# Patient Record
Sex: Male | Born: 1950
Health system: Southern US, Community
[De-identification: ages and names within clinical notes are randomized; demographics above are authoritative.]

## PROBLEM LIST (undated history)

## (undated) DIAGNOSIS — I10 Essential (primary) hypertension: Secondary | ICD-10-CM

## (undated) DIAGNOSIS — R7303 Prediabetes: Secondary | ICD-10-CM

## (undated) DIAGNOSIS — M1990 Unspecified osteoarthritis, unspecified site: Secondary | ICD-10-CM

## (undated) HISTORY — PX: CARPAL TUNNEL RELEASE: SHX101

---

## 2007-10-27 ENCOUNTER — Encounter: Admission: RE | Admit: 2007-10-27 | Discharge: 2007-10-27 | Payer: Self-pay | Admitting: Otolaryngology

## 2007-11-03 ENCOUNTER — Other Ambulatory Visit: Admission: RE | Admit: 2007-11-03 | Discharge: 2007-11-03 | Payer: Self-pay | Admitting: Otolaryngology

## 2008-04-02 HISTORY — PX: KNEE SURGERY: SHX244

## 2009-06-08 ENCOUNTER — Ambulatory Visit (HOSPITAL_BASED_OUTPATIENT_CLINIC_OR_DEPARTMENT_OTHER): Admission: RE | Admit: 2009-06-08 | Discharge: 2009-06-08 | Payer: Self-pay | Admitting: Orthopedic Surgery

## 2010-06-26 LAB — POCT HEMOGLOBIN-HEMACUE: Hemoglobin: 14.5 g/dL (ref 13.0–17.0)

## 2012-03-21 ENCOUNTER — Ambulatory Visit: Payer: BC Managed Care – PPO

## 2012-03-21 ENCOUNTER — Ambulatory Visit (INDEPENDENT_AMBULATORY_CARE_PROVIDER_SITE_OTHER): Payer: BC Managed Care – PPO | Admitting: Family Medicine

## 2012-03-21 VITALS — BP 158/89 | HR 77 | Temp 98.0°F | Resp 16 | Ht 69.0 in | Wt 221.0 lb

## 2012-03-21 DIAGNOSIS — M25512 Pain in left shoulder: Secondary | ICD-10-CM

## 2012-03-21 DIAGNOSIS — M542 Cervicalgia: Secondary | ICD-10-CM

## 2012-03-21 DIAGNOSIS — M25552 Pain in left hip: Secondary | ICD-10-CM

## 2012-03-21 DIAGNOSIS — M25559 Pain in unspecified hip: Secondary | ICD-10-CM

## 2012-03-21 DIAGNOSIS — M25519 Pain in unspecified shoulder: Secondary | ICD-10-CM

## 2012-03-21 MED ORDER — DICLOFENAC SODIUM 75 MG PO TBEC: 75.0000 mg | DELAYED_RELEASE_TABLET | Freq: Two times a day (BID) | ORAL | Status: DC

## 2012-03-21 MED ORDER — CYCLOBENZAPRINE HCL 5 MG PO TABS: 5.0000 mg | ORAL_TABLET | Freq: Two times a day (BID) | ORAL | Status: DC
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
## 2012-03-28 ENCOUNTER — Ambulatory Visit (INDEPENDENT_AMBULATORY_CARE_PROVIDER_SITE_OTHER): Payer: BC Managed Care – PPO | Admitting: Family Medicine

## 2012-03-28 ENCOUNTER — Encounter: Payer: Self-pay | Admitting: Family Medicine

## 2012-03-28 VITALS — BP 154/93 | HR 73 | Temp 98.3°F | Resp 16 | Ht 69.0 in | Wt 226.6 lb

## 2012-03-28 DIAGNOSIS — M25559 Pain in unspecified hip: Secondary | ICD-10-CM

## 2012-03-28 DIAGNOSIS — M62838 Other muscle spasm: Secondary | ICD-10-CM

## 2012-03-28 DIAGNOSIS — M25512 Pain in left shoulder: Secondary | ICD-10-CM

## 2012-03-28 DIAGNOSIS — M25519 Pain in unspecified shoulder: Secondary | ICD-10-CM

## 2012-03-28 DIAGNOSIS — M25552 Pain in left hip: Secondary | ICD-10-CM

## 2012-03-28 DIAGNOSIS — H538 Other visual disturbances: Secondary | ICD-10-CM

## 2012-03-28 MED ORDER — CYCLOBENZAPRINE HCL 5 MG PO TABS: ORAL_TABLET | ORAL | Status: DC
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
## 2012-04-02 DIAGNOSIS — Z0271 Encounter for disability determination: Secondary | ICD-10-CM

## 2012-04-04 ENCOUNTER — Ambulatory Visit (INDEPENDENT_AMBULATORY_CARE_PROVIDER_SITE_OTHER): Payer: BC Managed Care – PPO | Admitting: Family Medicine

## 2012-04-04 VITALS — BP 146/81 | HR 84 | Temp 98.2°F | Resp 18 | Ht 68.5 in | Wt 222.0 lb

## 2012-04-04 DIAGNOSIS — M25552 Pain in left hip: Secondary | ICD-10-CM

## 2012-04-04 DIAGNOSIS — M25519 Pain in unspecified shoulder: Secondary | ICD-10-CM

## 2012-04-04 DIAGNOSIS — M25512 Pain in left shoulder: Secondary | ICD-10-CM

## 2012-04-04 DIAGNOSIS — M25559 Pain in unspecified hip: Secondary | ICD-10-CM

## 2012-04-04 MED ORDER — DICLOFENAC SODIUM 75 MG PO TBEC: 75.0000 mg | DELAYED_RELEASE_TABLET | Freq: Two times a day (BID) | ORAL | Status: DC

## 2012-04-04 NOTE — Patient Instructions (Addendum)
Return to see Dr Neva Seat either Tuesday 1/14 9-5pm or Wednesday 1/15 2-8:30 pm  Continue diclofenac as needed, cyclobenzaprine u pto 3 times a day only as needed, continue to work on range of motion of shoulder and neck.  If any increase or worsening of headaches - return to clinic or go to an ER for possible ct scan of brain.   Return to the clinic or go to the nearest emergency room if any of your symptoms worsen or new symptoms occur.
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
## 2012-04-16 ENCOUNTER — Ambulatory Visit (INDEPENDENT_AMBULATORY_CARE_PROVIDER_SITE_OTHER): Payer: BC Managed Care – PPO | Admitting: Family Medicine

## 2012-04-16 VITALS — BP 132/86 | HR 81 | Temp 97.9°F | Resp 18 | Ht 68.5 in | Wt 216.0 lb

## 2012-04-16 DIAGNOSIS — M25519 Pain in unspecified shoulder: Secondary | ICD-10-CM

## 2012-04-16 DIAGNOSIS — S060X9A Concussion with loss of consciousness of unspecified duration, initial encounter: Secondary | ICD-10-CM

## 2012-04-16 DIAGNOSIS — M25559 Pain in unspecified hip: Secondary | ICD-10-CM

## 2012-04-16 DIAGNOSIS — R51 Headache: Secondary | ICD-10-CM

## 2012-04-16 NOTE — Patient Instructions (Signed)
As your headaches are resolved off meds, and neck, shoulder and hip are improved you can slowly return to exercise. Start with very low weights and less time than prior and slowly increase as tolerated. Correct lifting technique with lower weights is important.  If any headache - stop exercise and rest for few days before attempting to return. If headaches persist or worsen - return to clinic or the emergency room.  We can recheck in the next 3-4 weeks.  Return to the clinic or go to the nearest emergency room if any of your symptoms worsen or new symptoms occur.
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
## 2012-04-24 ENCOUNTER — Other Ambulatory Visit: Payer: Self-pay | Admitting: Family Medicine

## 2012-05-19 ENCOUNTER — Telehealth: Payer: Self-pay | Admitting: *Deleted

## 2012-05-19 NOTE — Telephone Encounter (Signed)
[facility] [address] requesting refill on diclofenac 75mg  and cyclobenzaprine 5mg .  Last fill 04/24/12

## 2012-05-20 ENCOUNTER — Ambulatory Visit (INDEPENDENT_AMBULATORY_CARE_PROVIDER_SITE_OTHER): Payer: Federal, State, Local not specified - PPO | Admitting: Family Medicine

## 2012-05-20 VITALS — BP 160/90 | HR 70 | Temp 98.2°F | Resp 18 | Ht 68.5 in | Wt 222.2 lb

## 2012-05-20 DIAGNOSIS — M25519 Pain in unspecified shoulder: Secondary | ICD-10-CM

## 2012-05-20 DIAGNOSIS — M542 Cervicalgia: Secondary | ICD-10-CM

## 2012-05-20 DIAGNOSIS — M25512 Pain in left shoulder: Secondary | ICD-10-CM

## 2012-05-20 DIAGNOSIS — R05 Cough: Secondary | ICD-10-CM

## 2012-05-20 DIAGNOSIS — M545 Low back pain, unspecified: Secondary | ICD-10-CM

## 2012-05-20 DIAGNOSIS — R059 Cough, unspecified: Secondary | ICD-10-CM

## 2012-05-20 MED ORDER — CYCLOBENZAPRINE HCL 5 MG PO TABS: ORAL_TABLET | ORAL | Status: DC

## 2012-05-20 MED ORDER — DICLOFENAC SODIUM 75 MG PO TBEC: DELAYED_RELEASE_TABLET | ORAL | Status: DC

## 2012-05-20 NOTE — Telephone Encounter (Signed)
The patient was to follow-up 3-4 weeks.  It has been 4.  If he still needs these meds, he should RTC.

## 2012-05-20 NOTE — Patient Instructions (Signed)
Your shoulder, neck, and hip pain,  and headaches have resolved, so no further visits needde for this unless symptoms return/worsen.  For your low back pain, likely muscle spasm with shoveling - ok to take flexeril at night, diclofenac if needed.   Start the blood pressure medicine as prescribed by your primary care provider and follow up there in next few weeks.  Return to the clinic or go to the nearest emergency room if any of your symptoms worsen or new symptoms occur.  For cough and cold - Saline nasal spray atleast 4 times per day if needed for congestion, over the counter mucinex or mucinex DM for cough, drink plenty of fluids.  Return to clinic if worsening.
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
## 2012-05-20 NOTE — Telephone Encounter (Signed)
Patient was seen today and got these.

## 2012-06-12 DIAGNOSIS — Z0271 Encounter for disability determination: Secondary | ICD-10-CM

## 2014-02-01 ENCOUNTER — Ambulatory Visit (INDEPENDENT_AMBULATORY_CARE_PROVIDER_SITE_OTHER): Payer: Federal, State, Local not specified - PPO | Admitting: Podiatry

## 2014-02-01 ENCOUNTER — Encounter: Payer: Self-pay | Admitting: Podiatry

## 2014-02-01 ENCOUNTER — Ambulatory Visit (INDEPENDENT_AMBULATORY_CARE_PROVIDER_SITE_OTHER): Payer: Federal, State, Local not specified - PPO

## 2014-02-01 VITALS — BP 146/90 | HR 68 | Resp 16 | Ht 68.0 in | Wt 228.0 lb

## 2014-02-01 DIAGNOSIS — M779 Enthesopathy, unspecified: Secondary | ICD-10-CM

## 2014-02-01 MED ORDER — TRIAMCINOLONE ACETONIDE 10 MG/ML IJ SUSP
10.0000 mg | Freq: Once | INTRAMUSCULAR | Status: AC
  Administered 2014-02-01: 10 mg

## 2014-02-01 NOTE — Progress Notes (Signed)
   Subjective:    Patient ID: Dean Williams, male    DOB: [birth year], 63 y.o.   MRN: [id number]  HPI Comments: "I have pain on the outside of my ankle area"  Patient c/o aching lateral foot and ankle bilateral for few years. He is active in the gym and runs. Doing exercise aggravates it. He has been using his tramadol and Ibuprofen-some help.     Review of Systems  HENT: Positive for sinus pressure.   Musculoskeletal: Positive for back pain, arthralgias and gait problem.  All other systems reviewed and are negative.      Objective:   Physical Exam        Assessment & Plan:

## 2014-02-02 NOTE — Progress Notes (Signed)
Subjective:     Patient ID: Dean Williams, male   DOB: [birth year], 63 y.o.   MRN: [id number]  HPIpatient presents stating the outside of his ankles of become quite a bit more sore over the last few months as he is increased his activity levels. States they are starting to hinder his ability to be active   Review of Systems  All other systems reviewed and are negative.      Objective:   Physical Exam  Constitutional: He is oriented to person, place, and time.  Cardiovascular: Intact distal pulses.   Musculoskeletal: Normal range of motion.  Neurological: He is oriented to person, place, and time.  Skin: Skin is warm.  Nursing note and vitals reviewed.  neurovascular status found to be intact with muscle strength adequate and range of motion subtalar and midtarsal joint within normal limits. Patient has normal inversion eversion and I noted on the lateral side of the ankles of both feet there is quite a bit of discomfort around the peroneal group with no indications of muscle strength loss. No tears are noted at this time     Assessment:     Probable peroneal tendinitis bilateral    Plan:     H&P and x-rays reviewed. Careful injection of the sheath of the peroneal tendon accomplished 3 mg Kenalog 5 mg Xylocaine and applied fascially brace bilateral and order to reduce motion. Reappoint to reevaluate in 2 weeks

## 2014-02-15 ENCOUNTER — Ambulatory Visit (INDEPENDENT_AMBULATORY_CARE_PROVIDER_SITE_OTHER): Payer: Federal, State, Local not specified - PPO | Admitting: Podiatry

## 2014-02-15 ENCOUNTER — Encounter: Payer: Self-pay | Admitting: Podiatry

## 2014-02-15 VITALS — BP 146/90 | HR 68 | Resp 16

## 2014-02-15 DIAGNOSIS — M779 Enthesopathy, unspecified: Secondary | ICD-10-CM

## 2014-02-16 NOTE — Progress Notes (Signed)
Subjective:     Patient ID: Dean Williams, male   DOB: 1950/09/14, 63 y.o.   MRN: [id number]  HPImy feet have been felt this good for years but they have been bothering me for a long time and I walk on cement floors   Review of Systems     Objective:   Physical Exam Neurovascular status intact with continued discomfort of a moderate nature at the peroneal insertion fifth metatarsal base with moderate flatfoot deformity and chronic tendinitis-like symptoms    Assessment:     Chronic structural changes with tendinitis causing pain    Plan:     Reviewed condition and recommended custom orthotics and scanned for custom orthotics at this time. Patient will utilize ice and will be seen back when orthotics are ready

## 2014-03-01 ENCOUNTER — Telehealth: Payer: Self-pay | Admitting: *Deleted

## 2014-03-01 NOTE — Telephone Encounter (Signed)
Scheduled patient to come in on 12.01.2015 to see Dr. Paulla Dolly for the pain and to puo.

## 2014-03-01 NOTE — Telephone Encounter (Signed)
Pt asked if his orthotics had arrived, and stated he is in severe pain and would like an appt.  I informed pt the orthotics were in and I referred to the schedulers for an appt this week due to his severe pain.

## 2014-03-02 ENCOUNTER — Ambulatory Visit (INDEPENDENT_AMBULATORY_CARE_PROVIDER_SITE_OTHER): Payer: Federal, State, Local not specified - PPO

## 2014-03-02 ENCOUNTER — Encounter: Payer: Self-pay | Admitting: Podiatry

## 2014-03-02 ENCOUNTER — Ambulatory Visit (INDEPENDENT_AMBULATORY_CARE_PROVIDER_SITE_OTHER): Payer: Federal, State, Local not specified - PPO | Admitting: Podiatry

## 2014-03-02 VITALS — BP 158/91 | HR 73 | Resp 16

## 2014-03-02 DIAGNOSIS — M7662 Achilles tendinitis, left leg: Secondary | ICD-10-CM

## 2014-03-02 MED ORDER — DICLOFENAC SODIUM 75 MG PO TBEC: 75.0000 mg | DELAYED_RELEASE_TABLET | Freq: Two times a day (BID) | ORAL | Status: DC

## 2014-03-02 MED ORDER — TRIAMCINOLONE ACETONIDE 10 MG/ML IJ SUSP
10.0000 mg | Freq: Once | INTRAMUSCULAR | Status: AC
  Administered 2014-03-02: 10 mg

## 2014-03-02 NOTE — Progress Notes (Signed)
Subjective:     Patient ID: Dean Williams, male   DOB: 06/16/1950, 63 y.o.   MRN: [id number]  HPI patient presents stating I am not having the pain in the outside of my left foot but the back of the heel has become extremely tender and I'm having trouble walking   Review of Systems     Objective:   Physical Exam Neurovascular status intact with muscle strength adequate and patient noted to have posterior lateral pain in the Achilles tendon left with no involvement of the musculotendinous junction    Assessment:     Achilles tendinitis left insertional in nature of an acute nature    Plan:     H&P and condition discussed. Careful lateral injection administered after first discussing the risk of the procedure with patient and chances for rupture. I did not put it directly into the tendon the Lateral and utilized 3 mg dexamethasone Kenalog 5 mg Xylocaine and advised on reduced activity and ice. Orthotics were dispensed with instructions

## 2014-03-02 NOTE — Patient Instructions (Signed)

## 2014-03-17 ENCOUNTER — Ambulatory Visit (INDEPENDENT_AMBULATORY_CARE_PROVIDER_SITE_OTHER): Payer: Federal, State, Local not specified - PPO | Admitting: Podiatry

## 2014-03-17 VITALS — BP 148/87 | HR 78 | Resp 16

## 2014-03-17 DIAGNOSIS — M7662 Achilles tendinitis, left leg: Secondary | ICD-10-CM

## 2014-03-17 DIAGNOSIS — M779 Enthesopathy, unspecified: Secondary | ICD-10-CM

## 2014-03-18 NOTE — Progress Notes (Signed)
Subjective:     Patient ID: Dean Williams, male   DOB: Jul 24, 1950, 63 y.o.   MRN: [id number]  HPI patient presents stating I am feeling a lot better in my heel and able to walk distances without pain   Review of Systems     Objective:   Physical Exam Neurovascular status intact with muscle strength adequate range of motion within normal limits and minimal discomfort when the posterior heel is palpated left    Assessment:     Achilles tendinitis left that's improving    Plan:     Advised on physical therapy anti-inflammatory and elevated heels in order to lift the posterior heel up. Reappoint to recheck

## 2014-04-09 DIAGNOSIS — Z23 Encounter for immunization: Secondary | ICD-10-CM | POA: Diagnosis not present

## 2014-04-27 DIAGNOSIS — M1712 Unilateral primary osteoarthritis, left knee: Secondary | ICD-10-CM | POA: Diagnosis not present

## 2014-05-17 ENCOUNTER — Encounter: Payer: Self-pay | Admitting: Podiatry

## 2014-05-17 ENCOUNTER — Ambulatory Visit (INDEPENDENT_AMBULATORY_CARE_PROVIDER_SITE_OTHER): Payer: Medicare Other | Admitting: Podiatry

## 2014-05-17 VITALS — BP 165/93 | HR 90 | Resp 16

## 2014-05-17 DIAGNOSIS — M7661 Achilles tendinitis, right leg: Secondary | ICD-10-CM | POA: Diagnosis not present

## 2014-05-17 DIAGNOSIS — M779 Enthesopathy, unspecified: Secondary | ICD-10-CM

## 2014-05-17 MED ORDER — TRIAMCINOLONE ACETONIDE 10 MG/ML IJ SUSP
10.0000 mg | Freq: Once | INTRAMUSCULAR | Status: AC
  Administered 2014-05-17: 10 mg

## 2014-05-17 NOTE — Patient Instructions (Signed)

## 2014-05-17 NOTE — Progress Notes (Signed)
Subjective:     Patient ID: Dean Williams, male   DOB: 02/24/1951, 64 y.o.   MRN: [id number]  HPIpatient states that his left heel is doing great but he has started to develop pain in his right heel that is making ambulation difficult. States it has worsened over the last month   Review of Systems     Objective:   Physical Exam Neurovascular status unchanged with pt. Well oriented.  He has pain lateral side of the achilles insertion with the central and medial side asymptomatic.    Assessment:     Acute achilles tendonitis left heel lateral side    Plan:     Reviewed condition and the importance of stretching which I rewrote him for today. Discussed injection which he wants understanding the risk of rupture. I carefully injected the lateral side with 2mg . Dexamethasone 1mg . Kenalog and 5mg . Xylocaine keeping it away from the central and medial side. Advised on reduced activity and ice therapy. Reappoint if symptoms persist

## 2014-06-07 DIAGNOSIS — M1712 Unilateral primary osteoarthritis, left knee: Secondary | ICD-10-CM | POA: Diagnosis not present

## 2014-06-14 DIAGNOSIS — M1712 Unilateral primary osteoarthritis, left knee: Secondary | ICD-10-CM | POA: Diagnosis not present

## 2014-06-22 DIAGNOSIS — M1712 Unilateral primary osteoarthritis, left knee: Secondary | ICD-10-CM | POA: Diagnosis not present

## 2014-06-29 DIAGNOSIS — M1712 Unilateral primary osteoarthritis, left knee: Secondary | ICD-10-CM | POA: Diagnosis not present

## 2014-07-06 DIAGNOSIS — M1712 Unilateral primary osteoarthritis, left knee: Secondary | ICD-10-CM | POA: Diagnosis not present

## 2014-08-03 DIAGNOSIS — M1712 Unilateral primary osteoarthritis, left knee: Secondary | ICD-10-CM | POA: Diagnosis not present

## 2014-08-03 DIAGNOSIS — M1711 Unilateral primary osteoarthritis, right knee: Secondary | ICD-10-CM | POA: Diagnosis not present

## 2014-08-25 DIAGNOSIS — M25521 Pain in right elbow: Secondary | ICD-10-CM | POA: Diagnosis not present

## 2014-08-26 DIAGNOSIS — M25521 Pain in right elbow: Secondary | ICD-10-CM | POA: Diagnosis not present

## 2014-08-26 DIAGNOSIS — M7021 Olecranon bursitis, right elbow: Secondary | ICD-10-CM | POA: Diagnosis not present

## 2014-09-02 ENCOUNTER — Encounter: Payer: Self-pay | Admitting: Podiatry

## 2014-09-02 ENCOUNTER — Ambulatory Visit (INDEPENDENT_AMBULATORY_CARE_PROVIDER_SITE_OTHER): Payer: Medicare Other | Admitting: Podiatry

## 2014-09-02 VITALS — BP 158/101 | HR 71 | Resp 18

## 2014-09-02 DIAGNOSIS — M722 Plantar fascial fibromatosis: Secondary | ICD-10-CM

## 2014-09-02 DIAGNOSIS — M7661 Achilles tendinitis, right leg: Secondary | ICD-10-CM

## 2014-09-02 NOTE — Progress Notes (Signed)
Subjective:     Patient ID: Dean Williams, male   DOB: [birth year], 64 y.o.   MRN: [id number]  HPI patient states that I am doing better but I still need to have braces to lift up my arches as they still get sore with activities   Review of Systems     Objective:   Physical Exam Vascular status intact muscle strength adequate range of motion within normal limits with discomfort in the arch region bilateral to moderate nature and especially when he tries to be active    Assessment:     Chronic plantar fascial symptomatology    Plan:     Reviewed conservative care consisting of brace usage orthotics and physical therapy. Reappoint to recheck as needed evaluated the nail which she traumatized and explained it should grow out okay but may eventually lose the left hallux nailbed due to the trauma

## 2014-09-09 DIAGNOSIS — M7021 Olecranon bursitis, right elbow: Secondary | ICD-10-CM | POA: Diagnosis not present

## 2014-10-28 DIAGNOSIS — M67911 Unspecified disorder of synovium and tendon, right shoulder: Secondary | ICD-10-CM | POA: Diagnosis not present

## 2014-10-28 DIAGNOSIS — M24811 Other specific joint derangements of right shoulder, not elsewhere classified: Secondary | ICD-10-CM | POA: Diagnosis not present

## 2014-10-29 DIAGNOSIS — M7021 Olecranon bursitis, right elbow: Secondary | ICD-10-CM | POA: Diagnosis not present

## 2014-10-29 DIAGNOSIS — M7521 Bicipital tendinitis, right shoulder: Secondary | ICD-10-CM | POA: Diagnosis not present

## 2014-11-01 DIAGNOSIS — M7021 Olecranon bursitis, right elbow: Secondary | ICD-10-CM | POA: Diagnosis not present

## 2014-11-08 DIAGNOSIS — M25521 Pain in right elbow: Secondary | ICD-10-CM | POA: Diagnosis not present

## 2014-11-11 DIAGNOSIS — M7021 Olecranon bursitis, right elbow: Secondary | ICD-10-CM | POA: Diagnosis not present

## 2014-11-11 DIAGNOSIS — M778 Other enthesopathies, not elsewhere classified: Secondary | ICD-10-CM | POA: Diagnosis not present

## 2014-12-22 DIAGNOSIS — M778 Other enthesopathies, not elsewhere classified: Secondary | ICD-10-CM | POA: Diagnosis not present

## 2014-12-22 DIAGNOSIS — M7021 Olecranon bursitis, right elbow: Secondary | ICD-10-CM | POA: Diagnosis not present

## 2015-01-07 ENCOUNTER — Telehealth: Payer: Self-pay | Admitting: *Deleted

## 2015-01-07 DIAGNOSIS — M7662 Achilles tendinitis, left leg: Secondary | ICD-10-CM

## 2015-01-07 MED ORDER — DICLOFENAC SODIUM 75 MG PO TBEC: 75.0000 mg | DELAYED_RELEASE_TABLET | Freq: Two times a day (BID) | ORAL | Status: DC

## 2015-01-07 NOTE — Telephone Encounter (Signed)
Pt states he wore hard sole shoes, on a hard floor last week and his achilles has flared again. I offered pt an appt, explaining he may run the risk of a rupture, to rest and make an appt.  Dr. Paulla Dolly ordered refill once and have pt make an appt if no better.  Pt agreed.  I ordered the Diclofenac 75mg  #50 one tablet bid no refills.

## 2015-01-12 ENCOUNTER — Telehealth: Payer: Self-pay | Admitting: Podiatry

## 2015-01-12 ENCOUNTER — Telehealth: Payer: Self-pay | Admitting: *Deleted

## 2015-01-12 NOTE — Telephone Encounter (Signed)
Pt called stating that his aklies in his right heel has started up again and said that he spoke with a nurse and she said he could get a RX and if it doesn't get any better too come in too see Dr.Regal asap

## 2015-01-12 NOTE — Telephone Encounter (Signed)
Pt called states the Diclofenac is not working, would like an appt.  I transferred to schedulers to get pt in this week.

## 2015-02-14 ENCOUNTER — Other Ambulatory Visit: Payer: Self-pay | Admitting: Podiatry

## 2015-02-22 ENCOUNTER — Other Ambulatory Visit: Payer: Self-pay | Admitting: Podiatry

## 2015-02-28 ENCOUNTER — Encounter: Payer: Self-pay | Admitting: Podiatry

## 2015-02-28 ENCOUNTER — Ambulatory Visit (INDEPENDENT_AMBULATORY_CARE_PROVIDER_SITE_OTHER): Payer: Medicare Other | Admitting: Podiatry

## 2015-02-28 VITALS — BP 170/96 | HR 67 | Resp 12

## 2015-02-28 DIAGNOSIS — M722 Plantar fascial fibromatosis: Secondary | ICD-10-CM | POA: Diagnosis not present

## 2015-02-28 DIAGNOSIS — M7661 Achilles tendinitis, right leg: Secondary | ICD-10-CM

## 2015-02-28 MED ORDER — MELOXICAM 15 MG PO TABS: 15.0000 mg | ORAL_TABLET | Freq: Every day | ORAL | Status: DC

## 2015-02-28 MED ORDER — TRIAMCINOLONE ACETONIDE 10 MG/ML IJ SUSP
10.0000 mg | Freq: Once | INTRAMUSCULAR | Status: AC
  Administered 2015-02-28: 10 mg

## 2015-03-01 NOTE — Progress Notes (Signed)
Subjective:     Patient ID: Dean Williams, male   DOB: [birth year], 64 y.o.   MRN: [id number]  HPI patient states the bottom of my heels have started to hurt again and I'm also getting some pain in the back of my heels if I walk too much   Review of Systems     Objective:   Physical Exam Neurovascular status intact muscle strength adequate with discomfort noted in the plantar heel bilateral and also mild discomfort in the posterior aspect of the heel bilateral    Assessment:     Acute plantar fasciitis bilateral with mild Achilles tendinitis    Plan:     Reviewed conditions and reinjected the plantar fascia bilateral 3 mg Kenalog 5 mg Xylocaine and went ahead today and discussed a dress type orthotic which I believe will be beneficial long-term for him. Patient will have a second pair of orthotics made and at this time is also worked on to modify his existing orthotic to reduce the arch

## 2015-03-03 ENCOUNTER — Encounter: Payer: Self-pay | Admitting: Podiatry

## 2015-03-03 ENCOUNTER — Ambulatory Visit (INDEPENDENT_AMBULATORY_CARE_PROVIDER_SITE_OTHER): Payer: Medicare Other | Admitting: Podiatry

## 2015-03-03 ENCOUNTER — Telehealth: Payer: Self-pay | Admitting: *Deleted

## 2015-03-03 VITALS — BP 169/105 | HR 67 | Resp 16

## 2015-03-03 DIAGNOSIS — M722 Plantar fascial fibromatosis: Secondary | ICD-10-CM | POA: Diagnosis not present

## 2015-03-03 MED ORDER — TRIAMCINOLONE ACETONIDE 10 MG/ML IJ SUSP
10.0000 mg | Freq: Once | INTRAMUSCULAR | Status: AC
  Administered 2015-03-03: 10 mg

## 2015-03-03 MED ORDER — PREDNISONE 10 MG PO TABS
ORAL_TABLET | ORAL | Status: DC
Start: 2015-03-03 — End: 2015-03-31

## 2015-03-03 NOTE — Telephone Encounter (Signed)
Pt states he's having so much trouble with his feet and walking after his appt on Monday, he made another appt to come in today.

## 2015-03-03 NOTE — Patient Instructions (Signed)

## 2015-03-04 NOTE — Progress Notes (Signed)
Subjective:     Patient ID: Dean Williams, male   DOB: 1950/06/28, 64 y.o.   MRN: [id number]  HPI patient states my heels are still hurting me quite a bit with inflammation around the medial band at the insertion   Review of Systems     Objective:   Physical Exam Neurovascular status intact muscle strength adequate with continued exquisite discomfort medial fascial band bilateral at the insertion tendon calcaneus    Assessment:     Continue plantar fasciitis bilateral heels    Plan:     Dispensed orthotics reinjected the plantar fascia bilateral 3 mg Kenalog 5 mill grams Xylocaine and reappoint to recheck

## 2015-03-17 DIAGNOSIS — Z23 Encounter for immunization: Secondary | ICD-10-CM | POA: Diagnosis not present

## 2015-03-17 DIAGNOSIS — Z0000 Encounter for general adult medical examination without abnormal findings: Secondary | ICD-10-CM | POA: Diagnosis not present

## 2015-03-17 DIAGNOSIS — R739 Hyperglycemia, unspecified: Secondary | ICD-10-CM | POA: Diagnosis not present

## 2015-03-17 DIAGNOSIS — Z1389 Encounter for screening for other disorder: Secondary | ICD-10-CM | POA: Diagnosis not present

## 2015-03-17 DIAGNOSIS — Z125 Encounter for screening for malignant neoplasm of prostate: Secondary | ICD-10-CM | POA: Diagnosis not present

## 2015-03-17 DIAGNOSIS — N529 Male erectile dysfunction, unspecified: Secondary | ICD-10-CM | POA: Diagnosis not present

## 2015-03-17 DIAGNOSIS — I10 Essential (primary) hypertension: Secondary | ICD-10-CM | POA: Diagnosis not present

## 2015-03-31 ENCOUNTER — Encounter: Payer: Self-pay | Admitting: Podiatry

## 2015-03-31 ENCOUNTER — Ambulatory Visit (INDEPENDENT_AMBULATORY_CARE_PROVIDER_SITE_OTHER): Payer: Medicare Other | Admitting: Podiatry

## 2015-03-31 VITALS — BP 119/85 | HR 74 | Resp 16

## 2015-03-31 DIAGNOSIS — M7662 Achilles tendinitis, left leg: Secondary | ICD-10-CM

## 2015-03-31 DIAGNOSIS — M722 Plantar fascial fibromatosis: Secondary | ICD-10-CM

## 2015-03-31 MED ORDER — TRIAMCINOLONE ACETONIDE 10 MG/ML IJ SUSP
10.0000 mg | Freq: Once | INTRAMUSCULAR | Status: AC
  Administered 2015-03-31: 10 mg

## 2015-03-31 NOTE — Progress Notes (Signed)
   Subjective:    Patient ID: Dean Williams, male    DOB: 01-06-1951, 64 y.o.   MRN: [id number]  HPI PUO on 03/31/15   Review of Systems     Objective:   Physical Exam        Assessment & Plan:

## 2015-03-31 NOTE — Progress Notes (Signed)
Subjective:     Patient ID: Dean Williams, male   DOB: [birth year], 64 y.o.   MRN: [id number]  HPI patient states the bottom of the heel has been doing pretty well but the back of the left heel has been real sore and it's making it hard for me to ambulate or even sleep due to the discomfort when it hits the bed   Review of Systems     Objective:   Physical Exam Neurovascular status intact muscle strength adequate range of motion within normal limits with patient found to have discomfort in the posterior lateral aspect of the left heel at the insertional point tendon into the calcaneus. The central and medial bands appear to be well with no discomfort and good strength    Assessment:     Achilles tendinitis left lateral side    Plan:     H&P condition reviewed and recommended careful injection explaining chances for risk to be followed with consideration for immobilization. He wants to go this route and today I did a careful injection of the left Achilles tendon lateral side 3 mg dexamethasone Kenalog 5 mg Xylocaine and then applied air fracture walker for complete immobilization. Reappoint to recheck

## 2015-04-05 ENCOUNTER — Ambulatory Visit (INDEPENDENT_AMBULATORY_CARE_PROVIDER_SITE_OTHER): Payer: Medicare Other | Admitting: Podiatry

## 2015-04-05 ENCOUNTER — Encounter: Payer: Self-pay | Admitting: Podiatry

## 2015-04-05 VITALS — BP 144/86 | HR 79 | Temp 98.8°F | Resp 12

## 2015-04-05 DIAGNOSIS — M10072 Idiopathic gout, left ankle and foot: Secondary | ICD-10-CM

## 2015-04-05 LAB — CBC WITH DIFFERENTIAL/PLATELET
BASOS PCT: 0 % (ref 0–1)
Basophils Absolute: 0 10*3/uL (ref 0.0–0.1)
Eosinophils Absolute: 0.1 10*3/uL (ref 0.0–0.7)
Eosinophils Relative: 1 % (ref 0–5)
HCT: 43.1 % (ref 39.0–52.0)
HEMOGLOBIN: 15 g/dL (ref 13.0–17.0)
Lymphocytes Relative: 50 % — ABNORMAL HIGH (ref 12–46)
Lymphs Abs: 3.2 10*3/uL (ref 0.7–4.0)
MCH: 29 pg (ref 26.0–34.0)
MCHC: 34.8 g/dL (ref 30.0–36.0)
MCV: 83.4 fL (ref 78.0–100.0)
MONO ABS: 0.4 10*3/uL (ref 0.1–1.0)
MPV: 9.4 fL (ref 8.6–12.4)
Monocytes Relative: 7 % (ref 3–12)
NEUTROS ABS: 2.6 10*3/uL (ref 1.7–7.7)
NEUTROS PCT: 42 % — AB (ref 43–77)
Platelets: 330 10*3/uL (ref 150–400)
RBC: 5.17 MIL/uL (ref 4.22–5.81)
RDW: 14.2 % (ref 11.5–15.5)
WBC: 6.3 10*3/uL (ref 4.0–10.5)

## 2015-04-05 LAB — URIC ACID: URIC ACID, SERUM: 10.1 mg/dL — AB (ref 4.0–7.8)

## 2015-04-05 LAB — C-REACTIVE PROTEIN: CRP: 5.6 mg/dL — ABNORMAL HIGH (ref <0.60)

## 2015-04-05 LAB — RHEUMATOID FACTOR: Rhuematoid fact SerPl-aCnc: <10 IU/mL (ref <=14)

## 2015-04-05 MED ORDER — HYDROCODONE-ACETAMINOPHEN 5-325 MG PO TABS: 1.0000 | ORAL_TABLET | ORAL | Status: DC | PRN

## 2015-04-05 MED ORDER — PREDNISONE 10 MG (21) PO TBPK: ORAL_TABLET | ORAL | Status: DC

## 2015-04-05 NOTE — Progress Notes (Signed)
   Subjective:    Patient ID: Dean Williams, male    DOB: 1950/04/05, 65 y.o.   MRN: [id number]  HPI   This patient presents today with a cute onset of a painful swollen left great toe joint and lateral left ankle. He denies any direct injury to this area. He was under treatment for Achilles tendinitis by Dr. Paulla Dolly and was wearing a boot that was dispensed on the previous visit of 03/31/2015. The left Achilles tendinitis has improved. When his foot became suddenly painful swollen and red he attributed this to the intolerance of the boot and he cannot tolerate the boot or a direct pressure even a shoe or even a sock he is using a crutch to ambulate he denies any other joint pain. He has been soaking in peroxide. He has a history of using tramadol, without any relief of symptoms  Review of Systems  Cardiovascular: Positive for leg swelling.  Musculoskeletal: Positive for gait problem.       Objective:   Physical Exam  Orientated 3 P of 144/86 Pulse 79 Respirations 12 Temperature 98.8  Vascular: DP and PT pulses 2/4 bilaterally Capillary reflex immediate bilaterally  Neurological: Sensation to 10 g monofilament wire intact 5/5 bilaterally Vibratory sensation reactive bilaterally Ankle reflex equal and reactive bilaterally  Dermatological: No open skin lesions bilaterally Low-grade erythema ,edema, warmth on the dorsal medial first left MPJ Low-grade edema lateral left ankle with low-grade erythema  Musculoskeletal: Exquisite tenderness to palpation left first MPJ not able to manually dorsi and plantar flex left first MPJ Exquisite tenderness lateral left ankle patient have difficulty sitting plantar flexing left ankle Manual motor testing dorsi flexion right 5/5 and 3/5 left exquisite tenderness localized to the first MPJ area Mild palpable tenderness in 6 insertional area tendo Achilles left without any palpable lesions      Assessment & Plan:    Assessment: Suspect gouty arthritis left first MPJ and left ankle Probable gout inflammation irritated by direct pressure boot  Plan: Today I reviewed the results of the examination with patient today and I am referring him to the lab for CBC with differential, sedimentation rate, uric acid, ANA, rheumatoid factor  Rx 6 day 10 mg prednisone Dosepak Rx hydrocodone 5/325 #12 sig 1 by mouth every 4-6 hours when necessary pain DC boot left Use crutch left  Reappoint 7 days

## 2015-04-05 NOTE — Patient Instructions (Signed)
Today your 4 day history of sudden painful red swollen left big toe joint and ankle possibly associated with gout I'm referring you to the lab for a blood test Begin taking year 10 mg prednisone six-day tapering Dosepak Pain pills prescribed You should crotch and only wear boot on left foot if tolerated

## 2015-04-06 LAB — SEDIMENTATION RATE: SED RATE: 4 mm/h (ref 0–20)

## 2015-04-07 LAB — ANA: Anti Nuclear Antibody(ANA): NEGATIVE

## 2015-04-12 ENCOUNTER — Telehealth: Payer: Self-pay | Admitting: *Deleted

## 2015-04-12 ENCOUNTER — Ambulatory Visit: Payer: Medicare Other | Admitting: Podiatry

## 2015-04-12 NOTE — Telephone Encounter (Addendum)
-----   Message from Gean Birchwood, DPM sent at 04/12/2015  7:52 AM EST ----- Lab results dated 04/05/2015 Uric acid elevated at 10.1, suggested of gout inflammation C-reactive protein elevated suggested of possible gout inflammation Patient currently completing prednisone Dosepak and evaluate patient at next scheduled visit Okay to call patient and advised him lab is suggestive gout inflammation.  Informed pt of results and he said he would see Dr. Amalia Hailey tomorrow.

## 2015-04-13 ENCOUNTER — Encounter: Payer: Self-pay | Admitting: Podiatry

## 2015-04-13 ENCOUNTER — Ambulatory Visit (INDEPENDENT_AMBULATORY_CARE_PROVIDER_SITE_OTHER): Payer: Medicare Other | Admitting: Podiatry

## 2015-04-13 VITALS — BP 138/96 | HR 70 | Resp 12

## 2015-04-13 DIAGNOSIS — M779 Enthesopathy, unspecified: Secondary | ICD-10-CM

## 2015-04-13 DIAGNOSIS — M10072 Idiopathic gout, left ankle and foot: Secondary | ICD-10-CM | POA: Diagnosis not present

## 2015-04-13 MED ORDER — ALLOPURINOL 100 MG PO TABS: 100.0000 mg | ORAL_TABLET | Freq: Every day | ORAL | Status: DC

## 2015-04-13 MED ORDER — TRIAMCINOLONE ACETONIDE 10 MG/ML IJ SUSP
10.0000 mg | Freq: Once | INTRAMUSCULAR | Status: AC
  Administered 2015-04-13: 10 mg

## 2015-04-13 NOTE — Patient Instructions (Signed)

## 2015-04-14 DIAGNOSIS — H04123 Dry eye syndrome of bilateral lacrimal glands: Secondary | ICD-10-CM | POA: Diagnosis not present

## 2015-04-14 DIAGNOSIS — H5203 Hypermetropia, bilateral: Secondary | ICD-10-CM | POA: Diagnosis not present

## 2015-04-14 DIAGNOSIS — H25013 Cortical age-related cataract, bilateral: Secondary | ICD-10-CM | POA: Diagnosis not present

## 2015-04-15 ENCOUNTER — Telehealth: Payer: Self-pay | Admitting: *Deleted

## 2015-04-15 DIAGNOSIS — M10072 Idiopathic gout, left ankle and foot: Secondary | ICD-10-CM

## 2015-04-15 MED ORDER — HYDROCODONE-ACETAMINOPHEN 5-325 MG PO TABS: 1.0000 | ORAL_TABLET | ORAL | Status: DC | PRN

## 2015-04-15 NOTE — Telephone Encounter (Addendum)
Pt states he had to stop the Allopurinol due to gases pains, constipation, increase in urination, fatigue and inability to sleep.  Pt states he is now having pains not only in the ankle but in the little toe as well.  Transferred to Schedulers to get an appt as soon as possible next week, and will ask Dr. Paulla Dolly for advise concerning medication.  Dr. Paulla Dolly states he really can't do anything different without seeing pt, but ordered Vicodin 5/325 # 20 1 tablet every 6 hours prn pain.  Orders to pt and pt states he has an appt 04/18/2015.

## 2015-04-15 NOTE — Progress Notes (Signed)
Subjective:     Patient ID: Dean Williams, male   DOB: 1951-02-13, 65 y.o.   MRN: [id number]  HPI patient presents stating that I'm having a lot of pain in the outside of my ankle now and that it's been inflamed and I do think I may have gout   Review of Systems     Objective:   Physical Exam Neurovascular status intact with quite a bit of discomfort in the peroneal tendon group left with fluid buildup and moderately better in the posterior heel group    Assessment:     Acute gout with inflammatory tendinitis    Plan:     Reviewed blood work indicating high uric acid and advised on allopurinol usage at this time along with injection of the lateral group 3 mg Kenalog 5 mill grams Xylocaine ice therapy and reappoint to recheck in the next several weeks

## 2015-04-18 ENCOUNTER — Ambulatory Visit: Payer: Medicare Other | Admitting: Podiatry

## 2015-04-21 ENCOUNTER — Ambulatory Visit: Payer: Medicare Other | Admitting: Podiatry

## 2015-04-26 ENCOUNTER — Encounter: Payer: Self-pay | Admitting: Podiatry

## 2015-04-26 DIAGNOSIS — M79672 Pain in left foot: Secondary | ICD-10-CM | POA: Diagnosis not present

## 2015-04-26 DIAGNOSIS — M79671 Pain in right foot: Secondary | ICD-10-CM | POA: Diagnosis not present

## 2015-04-28 DIAGNOSIS — Z23 Encounter for immunization: Secondary | ICD-10-CM | POA: Diagnosis not present

## 2015-05-10 DIAGNOSIS — M6528 Calcific tendinitis, other site: Secondary | ICD-10-CM | POA: Diagnosis not present

## 2015-05-10 DIAGNOSIS — M7662 Achilles tendinitis, left leg: Secondary | ICD-10-CM | POA: Diagnosis not present

## 2015-05-10 DIAGNOSIS — M7661 Achilles tendinitis, right leg: Secondary | ICD-10-CM | POA: Diagnosis not present

## 2015-06-08 ENCOUNTER — Ambulatory Visit: Payer: Medicare Other | Admitting: Podiatry

## 2015-07-28 DIAGNOSIS — M1711 Unilateral primary osteoarthritis, right knee: Secondary | ICD-10-CM | POA: Diagnosis not present

## 2015-07-28 DIAGNOSIS — M1712 Unilateral primary osteoarthritis, left knee: Secondary | ICD-10-CM | POA: Diagnosis not present

## 2015-09-19 DIAGNOSIS — E119 Type 2 diabetes mellitus without complications: Secondary | ICD-10-CM | POA: Diagnosis not present

## 2015-09-19 DIAGNOSIS — I10 Essential (primary) hypertension: Secondary | ICD-10-CM | POA: Diagnosis not present

## 2015-09-29 DIAGNOSIS — M1712 Unilateral primary osteoarthritis, left knee: Secondary | ICD-10-CM | POA: Diagnosis not present

## 2015-09-29 DIAGNOSIS — M1711 Unilateral primary osteoarthritis, right knee: Secondary | ICD-10-CM | POA: Diagnosis not present

## 2015-10-06 DIAGNOSIS — M1711 Unilateral primary osteoarthritis, right knee: Secondary | ICD-10-CM | POA: Diagnosis not present

## 2015-10-06 DIAGNOSIS — M1712 Unilateral primary osteoarthritis, left knee: Secondary | ICD-10-CM | POA: Diagnosis not present

## 2015-10-11 DIAGNOSIS — M1711 Unilateral primary osteoarthritis, right knee: Secondary | ICD-10-CM | POA: Diagnosis not present

## 2015-10-13 DIAGNOSIS — M1712 Unilateral primary osteoarthritis, left knee: Secondary | ICD-10-CM | POA: Diagnosis not present

## 2015-10-13 DIAGNOSIS — M1711 Unilateral primary osteoarthritis, right knee: Secondary | ICD-10-CM | POA: Diagnosis not present

## 2015-10-20 DIAGNOSIS — M1711 Unilateral primary osteoarthritis, right knee: Secondary | ICD-10-CM | POA: Diagnosis not present

## 2015-10-20 DIAGNOSIS — M1712 Unilateral primary osteoarthritis, left knee: Secondary | ICD-10-CM | POA: Diagnosis not present

## 2015-10-27 DIAGNOSIS — M1711 Unilateral primary osteoarthritis, right knee: Secondary | ICD-10-CM | POA: Diagnosis not present

## 2015-10-27 DIAGNOSIS — M1712 Unilateral primary osteoarthritis, left knee: Secondary | ICD-10-CM | POA: Diagnosis not present

## 2015-11-19 DIAGNOSIS — M5441 Lumbago with sciatica, right side: Secondary | ICD-10-CM | POA: Diagnosis not present

## 2015-11-19 DIAGNOSIS — M545 Low back pain: Secondary | ICD-10-CM | POA: Diagnosis not present

## 2015-12-06 DIAGNOSIS — R0789 Other chest pain: Secondary | ICD-10-CM | POA: Diagnosis not present

## 2015-12-08 DIAGNOSIS — M25512 Pain in left shoulder: Secondary | ICD-10-CM | POA: Diagnosis not present

## 2015-12-22 DIAGNOSIS — M545 Low back pain: Secondary | ICD-10-CM | POA: Diagnosis not present

## 2015-12-22 DIAGNOSIS — M25512 Pain in left shoulder: Secondary | ICD-10-CM | POA: Diagnosis not present

## 2015-12-22 DIAGNOSIS — S46811D Strain of other muscles, fascia and tendons at shoulder and upper arm level, right arm, subsequent encounter: Secondary | ICD-10-CM | POA: Diagnosis not present

## 2015-12-22 DIAGNOSIS — M5441 Lumbago with sciatica, right side: Secondary | ICD-10-CM | POA: Diagnosis not present

## 2015-12-22 DIAGNOSIS — M5442 Lumbago with sciatica, left side: Secondary | ICD-10-CM | POA: Diagnosis not present

## 2016-01-03 DIAGNOSIS — M545 Low back pain: Secondary | ICD-10-CM | POA: Diagnosis not present

## 2016-01-10 DIAGNOSIS — M5441 Lumbago with sciatica, right side: Secondary | ICD-10-CM | POA: Diagnosis not present

## 2016-01-16 DIAGNOSIS — M48061 Spinal stenosis, lumbar region without neurogenic claudication: Secondary | ICD-10-CM | POA: Diagnosis not present

## 2016-01-16 DIAGNOSIS — M47816 Spondylosis without myelopathy or radiculopathy, lumbar region: Secondary | ICD-10-CM | POA: Diagnosis not present

## 2016-01-17 DIAGNOSIS — M48061 Spinal stenosis, lumbar region without neurogenic claudication: Secondary | ICD-10-CM | POA: Diagnosis not present

## 2016-01-17 DIAGNOSIS — M545 Low back pain: Secondary | ICD-10-CM | POA: Diagnosis not present

## 2016-01-19 DIAGNOSIS — M48061 Spinal stenosis, lumbar region without neurogenic claudication: Secondary | ICD-10-CM | POA: Diagnosis not present

## 2016-01-19 DIAGNOSIS — M545 Low back pain: Secondary | ICD-10-CM | POA: Diagnosis not present

## 2016-01-23 DIAGNOSIS — M48061 Spinal stenosis, lumbar region without neurogenic claudication: Secondary | ICD-10-CM | POA: Diagnosis not present

## 2016-01-23 DIAGNOSIS — M545 Low back pain: Secondary | ICD-10-CM | POA: Diagnosis not present

## 2016-01-24 DIAGNOSIS — M47816 Spondylosis without myelopathy or radiculopathy, lumbar region: Secondary | ICD-10-CM | POA: Diagnosis not present

## 2016-01-27 DIAGNOSIS — M545 Low back pain: Secondary | ICD-10-CM | POA: Diagnosis not present

## 2016-01-27 DIAGNOSIS — M48061 Spinal stenosis, lumbar region without neurogenic claudication: Secondary | ICD-10-CM | POA: Diagnosis not present

## 2016-02-01 DIAGNOSIS — M48061 Spinal stenosis, lumbar region without neurogenic claudication: Secondary | ICD-10-CM | POA: Diagnosis not present

## 2016-02-01 DIAGNOSIS — M545 Low back pain: Secondary | ICD-10-CM | POA: Diagnosis not present

## 2016-02-03 DIAGNOSIS — M48061 Spinal stenosis, lumbar region without neurogenic claudication: Secondary | ICD-10-CM | POA: Diagnosis not present

## 2016-02-03 DIAGNOSIS — M545 Low back pain: Secondary | ICD-10-CM | POA: Diagnosis not present

## 2016-02-07 DIAGNOSIS — M545 Low back pain: Secondary | ICD-10-CM | POA: Diagnosis not present

## 2016-02-07 DIAGNOSIS — M48061 Spinal stenosis, lumbar region without neurogenic claudication: Secondary | ICD-10-CM | POA: Diagnosis not present

## 2016-02-08 DIAGNOSIS — M47816 Spondylosis without myelopathy or radiculopathy, lumbar region: Secondary | ICD-10-CM | POA: Diagnosis not present

## 2016-02-09 DIAGNOSIS — M545 Low back pain: Secondary | ICD-10-CM | POA: Diagnosis not present

## 2016-02-09 DIAGNOSIS — M48061 Spinal stenosis, lumbar region without neurogenic claudication: Secondary | ICD-10-CM | POA: Diagnosis not present

## 2016-02-13 DIAGNOSIS — M545 Low back pain: Secondary | ICD-10-CM | POA: Diagnosis not present

## 2016-02-13 DIAGNOSIS — M48061 Spinal stenosis, lumbar region without neurogenic claudication: Secondary | ICD-10-CM | POA: Diagnosis not present

## 2016-02-16 DIAGNOSIS — M48061 Spinal stenosis, lumbar region without neurogenic claudication: Secondary | ICD-10-CM | POA: Diagnosis not present

## 2016-02-16 DIAGNOSIS — M545 Low back pain: Secondary | ICD-10-CM | POA: Diagnosis not present

## 2016-02-22 DIAGNOSIS — M48061 Spinal stenosis, lumbar region without neurogenic claudication: Secondary | ICD-10-CM | POA: Diagnosis not present

## 2016-02-22 DIAGNOSIS — M545 Low back pain: Secondary | ICD-10-CM | POA: Diagnosis not present

## 2016-02-28 DIAGNOSIS — M48061 Spinal stenosis, lumbar region without neurogenic claudication: Secondary | ICD-10-CM | POA: Diagnosis not present

## 2016-02-28 DIAGNOSIS — M545 Low back pain: Secondary | ICD-10-CM | POA: Diagnosis not present

## 2016-03-01 DIAGNOSIS — M545 Low back pain: Secondary | ICD-10-CM | POA: Diagnosis not present

## 2016-03-01 DIAGNOSIS — M48061 Spinal stenosis, lumbar region without neurogenic claudication: Secondary | ICD-10-CM | POA: Diagnosis not present

## 2016-03-06 DIAGNOSIS — L603 Nail dystrophy: Secondary | ICD-10-CM | POA: Diagnosis not present

## 2016-03-14 DIAGNOSIS — R143 Flatulence: Secondary | ICD-10-CM | POA: Diagnosis not present

## 2016-03-14 DIAGNOSIS — Z0000 Encounter for general adult medical examination without abnormal findings: Secondary | ICD-10-CM | POA: Diagnosis not present

## 2016-03-14 DIAGNOSIS — E119 Type 2 diabetes mellitus without complications: Secondary | ICD-10-CM | POA: Diagnosis not present

## 2016-03-14 DIAGNOSIS — Z7189 Other specified counseling: Secondary | ICD-10-CM | POA: Diagnosis not present

## 2016-03-14 DIAGNOSIS — A6000 Herpesviral infection of urogenital system, unspecified: Secondary | ICD-10-CM | POA: Diagnosis not present

## 2016-03-14 DIAGNOSIS — Z23 Encounter for immunization: Secondary | ICD-10-CM | POA: Diagnosis not present

## 2016-03-14 DIAGNOSIS — Z1389 Encounter for screening for other disorder: Secondary | ICD-10-CM | POA: Diagnosis not present

## 2016-03-14 DIAGNOSIS — I10 Essential (primary) hypertension: Secondary | ICD-10-CM | POA: Diagnosis not present

## 2016-06-05 DIAGNOSIS — M25561 Pain in right knee: Secondary | ICD-10-CM | POA: Diagnosis not present

## 2016-06-05 DIAGNOSIS — M170 Bilateral primary osteoarthritis of knee: Secondary | ICD-10-CM | POA: Diagnosis not present

## 2016-06-05 DIAGNOSIS — M25562 Pain in left knee: Secondary | ICD-10-CM | POA: Diagnosis not present

## 2016-06-07 DIAGNOSIS — H25013 Cortical age-related cataract, bilateral: Secondary | ICD-10-CM | POA: Diagnosis not present

## 2016-06-07 DIAGNOSIS — H04123 Dry eye syndrome of bilateral lacrimal glands: Secondary | ICD-10-CM | POA: Diagnosis not present

## 2016-06-12 DIAGNOSIS — M25562 Pain in left knee: Secondary | ICD-10-CM | POA: Diagnosis not present

## 2016-06-12 DIAGNOSIS — M1712 Unilateral primary osteoarthritis, left knee: Secondary | ICD-10-CM | POA: Diagnosis not present

## 2016-06-13 DIAGNOSIS — M25561 Pain in right knee: Secondary | ICD-10-CM | POA: Diagnosis not present

## 2016-06-13 DIAGNOSIS — M1711 Unilateral primary osteoarthritis, right knee: Secondary | ICD-10-CM | POA: Diagnosis not present

## 2016-06-19 DIAGNOSIS — M25562 Pain in left knee: Secondary | ICD-10-CM | POA: Diagnosis not present

## 2016-06-19 DIAGNOSIS — M1712 Unilateral primary osteoarthritis, left knee: Secondary | ICD-10-CM | POA: Diagnosis not present

## 2016-06-20 DIAGNOSIS — M1711 Unilateral primary osteoarthritis, right knee: Secondary | ICD-10-CM | POA: Diagnosis not present

## 2016-06-20 DIAGNOSIS — M25561 Pain in right knee: Secondary | ICD-10-CM | POA: Diagnosis not present

## 2016-06-26 DIAGNOSIS — M25562 Pain in left knee: Secondary | ICD-10-CM | POA: Diagnosis not present

## 2016-06-26 DIAGNOSIS — M170 Bilateral primary osteoarthritis of knee: Secondary | ICD-10-CM | POA: Diagnosis not present

## 2016-06-26 DIAGNOSIS — M25561 Pain in right knee: Secondary | ICD-10-CM | POA: Diagnosis not present

## 2016-07-10 DIAGNOSIS — G5601 Carpal tunnel syndrome, right upper limb: Secondary | ICD-10-CM | POA: Diagnosis not present

## 2016-07-10 DIAGNOSIS — G5602 Carpal tunnel syndrome, left upper limb: Secondary | ICD-10-CM | POA: Diagnosis not present

## 2016-07-13 DIAGNOSIS — R200 Anesthesia of skin: Secondary | ICD-10-CM | POA: Diagnosis not present

## 2016-07-13 DIAGNOSIS — M47816 Spondylosis without myelopathy or radiculopathy, lumbar region: Secondary | ICD-10-CM | POA: Diagnosis not present

## 2016-08-01 DIAGNOSIS — G5601 Carpal tunnel syndrome, right upper limb: Secondary | ICD-10-CM | POA: Diagnosis not present

## 2016-08-01 DIAGNOSIS — G5602 Carpal tunnel syndrome, left upper limb: Secondary | ICD-10-CM | POA: Diagnosis not present

## 2016-08-08 DIAGNOSIS — G5602 Carpal tunnel syndrome, left upper limb: Secondary | ICD-10-CM | POA: Diagnosis not present

## 2016-08-08 DIAGNOSIS — G5601 Carpal tunnel syndrome, right upper limb: Secondary | ICD-10-CM | POA: Diagnosis not present

## 2016-08-29 DIAGNOSIS — G5601 Carpal tunnel syndrome, right upper limb: Secondary | ICD-10-CM | POA: Diagnosis not present

## 2016-09-12 DIAGNOSIS — M79671 Pain in right foot: Secondary | ICD-10-CM | POA: Diagnosis not present

## 2016-09-12 DIAGNOSIS — G8929 Other chronic pain: Secondary | ICD-10-CM | POA: Diagnosis not present

## 2016-09-12 DIAGNOSIS — M7661 Achilles tendinitis, right leg: Secondary | ICD-10-CM | POA: Diagnosis not present

## 2016-09-21 DIAGNOSIS — M7661 Achilles tendinitis, right leg: Secondary | ICD-10-CM | POA: Diagnosis not present

## 2016-09-21 DIAGNOSIS — R202 Paresthesia of skin: Secondary | ICD-10-CM | POA: Diagnosis not present

## 2016-09-21 DIAGNOSIS — G5603 Carpal tunnel syndrome, bilateral upper limbs: Secondary | ICD-10-CM | POA: Diagnosis not present

## 2016-09-21 DIAGNOSIS — I10 Essential (primary) hypertension: Secondary | ICD-10-CM | POA: Diagnosis not present

## 2016-09-21 DIAGNOSIS — Z6834 Body mass index (BMI) 34.0-34.9, adult: Secondary | ICD-10-CM | POA: Diagnosis not present

## 2016-09-21 DIAGNOSIS — E669 Obesity, unspecified: Secondary | ICD-10-CM | POA: Diagnosis not present

## 2016-09-21 DIAGNOSIS — R7301 Impaired fasting glucose: Secondary | ICD-10-CM | POA: Diagnosis not present

## 2016-09-25 DIAGNOSIS — M7661 Achilles tendinitis, right leg: Secondary | ICD-10-CM | POA: Diagnosis not present

## 2016-09-27 DIAGNOSIS — M7661 Achilles tendinitis, right leg: Secondary | ICD-10-CM | POA: Diagnosis not present

## 2016-10-01 DIAGNOSIS — M25562 Pain in left knee: Secondary | ICD-10-CM | POA: Diagnosis not present

## 2016-10-01 DIAGNOSIS — M25561 Pain in right knee: Secondary | ICD-10-CM | POA: Diagnosis not present

## 2016-10-01 DIAGNOSIS — M170 Bilateral primary osteoarthritis of knee: Secondary | ICD-10-CM | POA: Diagnosis not present

## 2016-10-02 DIAGNOSIS — M7661 Achilles tendinitis, right leg: Secondary | ICD-10-CM | POA: Diagnosis not present

## 2016-10-04 DIAGNOSIS — M7661 Achilles tendinitis, right leg: Secondary | ICD-10-CM | POA: Diagnosis not present

## 2016-10-09 DIAGNOSIS — M7661 Achilles tendinitis, right leg: Secondary | ICD-10-CM | POA: Diagnosis not present

## 2016-11-13 DIAGNOSIS — M47816 Spondylosis without myelopathy or radiculopathy, lumbar region: Secondary | ICD-10-CM | POA: Diagnosis not present

## 2016-12-25 DIAGNOSIS — G5601 Carpal tunnel syndrome, right upper limb: Secondary | ICD-10-CM | POA: Diagnosis not present

## 2016-12-26 DIAGNOSIS — G5602 Carpal tunnel syndrome, left upper limb: Secondary | ICD-10-CM | POA: Diagnosis not present

## 2016-12-26 DIAGNOSIS — G5601 Carpal tunnel syndrome, right upper limb: Secondary | ICD-10-CM | POA: Diagnosis not present

## 2017-01-04 DIAGNOSIS — G5602 Carpal tunnel syndrome, left upper limb: Secondary | ICD-10-CM | POA: Diagnosis not present

## 2017-01-14 DIAGNOSIS — G5602 Carpal tunnel syndrome, left upper limb: Secondary | ICD-10-CM | POA: Diagnosis not present

## 2017-02-13 DIAGNOSIS — Z23 Encounter for immunization: Secondary | ICD-10-CM | POA: Diagnosis not present

## 2017-03-22 DIAGNOSIS — I10 Essential (primary) hypertension: Secondary | ICD-10-CM | POA: Diagnosis not present

## 2017-03-22 DIAGNOSIS — E669 Obesity, unspecified: Secondary | ICD-10-CM | POA: Diagnosis not present

## 2017-03-22 DIAGNOSIS — Z125 Encounter for screening for malignant neoplasm of prostate: Secondary | ICD-10-CM | POA: Diagnosis not present

## 2017-03-22 DIAGNOSIS — Z0000 Encounter for general adult medical examination without abnormal findings: Secondary | ICD-10-CM | POA: Diagnosis not present

## 2017-03-22 DIAGNOSIS — Z7189 Other specified counseling: Secondary | ICD-10-CM | POA: Diagnosis not present

## 2017-03-22 DIAGNOSIS — Z1389 Encounter for screening for other disorder: Secondary | ICD-10-CM | POA: Diagnosis not present

## 2017-03-22 DIAGNOSIS — E119 Type 2 diabetes mellitus without complications: Secondary | ICD-10-CM | POA: Diagnosis not present

## 2017-03-22 DIAGNOSIS — Z6834 Body mass index (BMI) 34.0-34.9, adult: Secondary | ICD-10-CM | POA: Diagnosis not present

## 2017-03-22 DIAGNOSIS — D126 Benign neoplasm of colon, unspecified: Secondary | ICD-10-CM | POA: Diagnosis not present

## 2017-03-22 DIAGNOSIS — Z135 Encounter for screening for eye and ear disorders: Secondary | ICD-10-CM | POA: Diagnosis not present

## 2017-04-24 DIAGNOSIS — G5601 Carpal tunnel syndrome, right upper limb: Secondary | ICD-10-CM | POA: Diagnosis not present

## 2017-04-24 DIAGNOSIS — G5602 Carpal tunnel syndrome, left upper limb: Secondary | ICD-10-CM | POA: Diagnosis not present

## 2017-04-24 DIAGNOSIS — M25541 Pain in joints of right hand: Secondary | ICD-10-CM | POA: Diagnosis not present

## 2017-06-06 DIAGNOSIS — Z86010 Personal history of colonic polyps: Secondary | ICD-10-CM | POA: Diagnosis not present

## 2017-06-06 DIAGNOSIS — D126 Benign neoplasm of colon, unspecified: Secondary | ICD-10-CM | POA: Diagnosis not present

## 2017-06-07 DIAGNOSIS — H52203 Unspecified astigmatism, bilateral: Secondary | ICD-10-CM | POA: Diagnosis not present

## 2017-06-07 DIAGNOSIS — H25013 Cortical age-related cataract, bilateral: Secondary | ICD-10-CM | POA: Diagnosis not present

## 2017-06-11 DIAGNOSIS — D126 Benign neoplasm of colon, unspecified: Secondary | ICD-10-CM | POA: Diagnosis not present

## 2017-06-25 DIAGNOSIS — M25562 Pain in left knee: Secondary | ICD-10-CM | POA: Diagnosis not present

## 2017-06-25 DIAGNOSIS — M25552 Pain in left hip: Secondary | ICD-10-CM | POA: Diagnosis not present

## 2017-06-25 DIAGNOSIS — M25561 Pain in right knee: Secondary | ICD-10-CM | POA: Diagnosis not present

## 2017-06-25 DIAGNOSIS — M25551 Pain in right hip: Secondary | ICD-10-CM | POA: Diagnosis not present

## 2017-07-16 DIAGNOSIS — M1712 Unilateral primary osteoarthritis, left knee: Secondary | ICD-10-CM | POA: Diagnosis not present

## 2017-07-16 DIAGNOSIS — M5134 Other intervertebral disc degeneration, thoracic region: Secondary | ICD-10-CM | POA: Diagnosis not present

## 2017-07-16 DIAGNOSIS — M1711 Unilateral primary osteoarthritis, right knee: Secondary | ICD-10-CM | POA: Diagnosis not present

## 2017-08-15 DIAGNOSIS — M5442 Lumbago with sciatica, left side: Secondary | ICD-10-CM | POA: Diagnosis not present

## 2017-08-15 DIAGNOSIS — M5441 Lumbago with sciatica, right side: Secondary | ICD-10-CM | POA: Diagnosis not present

## 2017-08-15 DIAGNOSIS — M545 Low back pain: Secondary | ICD-10-CM | POA: Diagnosis not present

## 2017-11-28 DIAGNOSIS — E1169 Type 2 diabetes mellitus with other specified complication: Secondary | ICD-10-CM | POA: Diagnosis not present

## 2017-11-28 DIAGNOSIS — I10 Essential (primary) hypertension: Secondary | ICD-10-CM | POA: Diagnosis not present

## 2018-04-15 DIAGNOSIS — M25561 Pain in right knee: Secondary | ICD-10-CM | POA: Diagnosis not present

## 2018-04-15 DIAGNOSIS — M1711 Unilateral primary osteoarthritis, right knee: Secondary | ICD-10-CM | POA: Diagnosis not present

## 2018-04-15 DIAGNOSIS — M1712 Unilateral primary osteoarthritis, left knee: Secondary | ICD-10-CM | POA: Diagnosis not present

## 2018-04-15 DIAGNOSIS — M25562 Pain in left knee: Secondary | ICD-10-CM | POA: Diagnosis not present

## 2018-04-21 ENCOUNTER — Other Ambulatory Visit: Payer: Self-pay | Admitting: Orthopedic Surgery

## 2018-05-01 ENCOUNTER — Other Ambulatory Visit: Payer: Self-pay | Admitting: Orthopedic Surgery

## 2018-05-01 ENCOUNTER — Encounter (HOSPITAL_COMMUNITY): Payer: Self-pay

## 2018-05-01 NOTE — Care Plan (Signed)
Spoke with patient prior to surgery. He will discharge to home with family to assist. I have ordered rolling walker and CPM to be delivered to him. [employer] will try to deliver prior to surgery if possible. He will transition to OPPT as appropriate. He is aware and agreeable with this plan.    Ladell Heads, [place]

## 2018-05-01 NOTE — Patient Instructions (Signed)
Dean Williams  05/01/2018   Your procedure is scheduled on: 05-09-18  Report to [facility] Main  Entrance  Report to admitting at        [place] AM    Call this number if you have problems the morning of surgery [phone]    Remember: Do not eat food or drink liquids :After Midnight. BRUSH YOUR TEETH MORNING OF SURGERY AND RINSE YOUR MOUTH OUT, NO CHEWING GUM CANDY OR MINTS.     Take these medicines the morning of surgery with A SIP OF WATER: amlodipine                                You may not have any metal on your body including hair pins and              piercings  Do not wear jewelry,  lotions, powders or perfumes, deodorant                   Men may shave face and neck.   Do not bring valuables to the hospital. [place].  Contacts, dentures or bridgework may not be worn into surgery.  Leave suitcase in the car. After surgery it may be brought to your room.                Please read over the following fact sheets you were given: ____________________________________________________________________            [facility] - Preparing for Surgery Before surgery, you can play an important role.  Because skin is not sterile, your skin needs to be as free of germs as possible.  You can reduce the number of germs on your skin by washing with CHG (chlorahexidine gluconate) soap before surgery.  CHG is an antiseptic cleaner which kills germs and bonds with the skin to continue killing germs even after washing. Please DO NOT use if you have an allergy to CHG or antibacterial soaps.  If your skin becomes reddened/irritated stop using the CHG and inform your nurse when you arrive at Short Stay. Do not shave (including legs and underarms) for at least 48 hours prior to the first CHG shower.  You may shave your face/neck. Please follow these instructions carefully:  1.  Shower with CHG Soap the night before  surgery and the  morning of Surgery.  2.  If you choose to wash your hair, wash your hair first as usual with your  normal  shampoo.  3.  After you shampoo, rinse your hair and body thoroughly to remove the  shampoo.                           4.  Use CHG as you would any other liquid soap.  You can apply chg directly  to the skin and wash                       Gently with a scrungie or clean washcloth.  5.  Apply the CHG Soap to your body ONLY FROM THE NECK DOWN.   Do not use on face/ open  Wound or open sores. Avoid contact with eyes, ears mouth and genitals (private parts).                       Wash face,  Genitals (private parts) with your normal soap.             6.  Wash thoroughly, paying special attention to the area where your surgery  will be performed.  7.  Thoroughly rinse your body with warm water from the neck down.  8.  DO NOT shower/wash with your normal soap after using and rinsing off  the CHG Soap.                9.  Pat yourself dry with a clean towel.            10.  Wear clean pajamas.            11.  Place clean sheets on your bed the night of your first shower and do not  sleep with pets. Day of Surgery : Do not apply any lotions/deodorants the morning of surgery.  Please wear clean clothes to the hospital/surgery center.  FAILURE TO FOLLOW THESE INSTRUCTIONS MAY RESULT IN THE CANCELLATION OF YOUR SURGERY PATIENT SIGNATURE_________________________________  NURSE SIGNATURE__________________________________  ________________________________________________________________________  WHAT IS A BLOOD TRANSFUSION? Blood Transfusion Information  A transfusion is the replacement of blood or some of its parts. Blood is made up of multiple cells which provide different functions.  Red blood cells carry oxygen and are used for blood loss replacement.  White blood cells fight against infection.  Platelets control bleeding.  Plasma helps clot  blood.  Other blood products are available for specialized needs, such as hemophilia or other clotting disorders. BEFORE THE TRANSFUSION  Who gives blood for transfusions?   Healthy volunteers who are fully evaluated to make sure their blood is safe. This is blood bank blood. Transfusion therapy is the safest it has ever been in the practice of medicine. Before blood is taken from a donor, a complete history is taken to make sure that person has no history of diseases nor engages in risky social behavior (examples are intravenous drug use or sexual activity with multiple partners). The donor's travel history is screened to minimize risk of transmitting infections, such as malaria. The donated blood is tested for signs of infectious diseases, such as HIV and hepatitis. The blood is then tested to be sure it is compatible with you in order to minimize the chance of a transfusion reaction. If you or a relative donates blood, this is often done in anticipation of surgery and is not appropriate for emergency situations. It takes many days to process the donated blood. RISKS AND COMPLICATIONS Although transfusion therapy is very safe and saves many lives, the main dangers of transfusion include:   Getting an infectious disease.  Developing a transfusion reaction. This is an allergic reaction to something in the blood you were given. Every precaution is taken to prevent this. The decision to have a blood transfusion has been considered carefully by your caregiver before blood is given. Blood is not given unless the benefits outweigh the risks. AFTER THE TRANSFUSION  Right after receiving a blood transfusion, you will usually feel much better and more energetic. This is especially true if your red blood cells have gotten low (anemic). The transfusion raises the level of the red blood cells which carry oxygen, and this usually causes an energy increase.  The  nurse administering the transfusion will monitor  you carefully for complications. HOME CARE INSTRUCTIONS  No special instructions are needed after a transfusion. You may find your energy is better. Speak with your caregiver about any limitations on activity for underlying diseases you may have. SEEK MEDICAL CARE IF:   Your condition is not improving after your transfusion.  You develop redness or irritation at the intravenous (IV) site. SEEK IMMEDIATE MEDICAL CARE IF:  Any of the following symptoms occur over the next 12 hours:  Shaking chills.  You have a temperature by mouth above 102 F (38.9 C), not controlled by medicine.  Chest, back, or muscle pain.  People around you feel you are not acting correctly or are confused.  Shortness of breath or difficulty breathing.  Dizziness and fainting.  You get a rash or develop hives.  You have a decrease in urine output.  Your urine turns a dark color or changes to pink, red, or brown. Any of the following symptoms occur over the next 10 days:  You have a temperature by mouth above 102 F (38.9 C), not controlled by medicine.  Shortness of breath.  Weakness after normal activity.  The white part of the eye turns yellow (jaundice).  You have a decrease in the amount of urine or are urinating less often.  Your urine turns a dark color or changes to pink, red, or brown. Document Released: 03/16/2000 Document Revised: 06/11/2011 Document Reviewed: 11/03/2007 ExitCare Patient Information 2014 [place].  _______________________________________________________________________  Incentive Spirometer  An incentive spirometer is a tool that can help keep your lungs clear and active. This tool measures how well you are filling your lungs with each breath. Taking long deep breaths may help reverse or decrease the chance of developing breathing (pulmonary) problems (especially infection) following:  A long period of time when you are unable to move or be active. BEFORE THE  PROCEDURE   If the spirometer includes an indicator to show your best effort, your nurse or respiratory therapist will set it to a desired goal.  If possible, sit up straight or lean slightly forward. Try not to slouch.  Hold the incentive spirometer in an upright position. INSTRUCTIONS FOR USE  1. Sit on the edge of your bed if possible, or sit up as far as you can in bed or on a chair. 2. Hold the incentive spirometer in an upright position. 3. Breathe out normally. 4. Place the mouthpiece in your mouth and seal your lips tightly around it. 5. Breathe in slowly and as deeply as possible, raising the piston or the ball toward the top of the column. 6. Hold your breath for 3-5 seconds or for as long as possible. Allow the piston or ball to fall to the bottom of the column. 7. Remove the mouthpiece from your mouth and breathe out normally. 8. Rest for a few seconds and repeat Steps 1 through 7 at least 10 times every 1-2 hours when you are awake. Take your time and take a few normal breaths between deep breaths. 9. The spirometer may include an indicator to show your best effort. Use the indicator as a goal to work toward during each repetition. 10. After each set of 10 deep breaths, practice coughing to be sure your lungs are clear. If you have an incision (the cut made at the time of surgery), support your incision when coughing by placing a pillow or rolled up towels firmly against it. Once you are able to get  out of bed, walk around indoors and cough well. You may stop using the incentive spirometer when instructed by your caregiver.  RISKS AND COMPLICATIONS  Take your time so you do not get dizzy or light-headed.  If you are in pain, you may need to take or ask for pain medication before doing incentive spirometry. It is harder to take a deep breath if you are having pain. AFTER USE  Rest and breathe slowly and easily.  It can be helpful to keep track of a log of your progress. Your  caregiver can provide you with a simple table to help with this. If you are using the spirometer at home, follow these instructions: [place] IF:   You are having difficultly using the spirometer.  You have trouble using the spirometer as often as instructed.  Your pain medication is not giving enough relief while using the spirometer.  You develop fever of 100.5 F (38.1 C) or higher. SEEK IMMEDIATE MEDICAL CARE IF:   You cough up bloody sputum that had not been present before.  You develop fever of 102 F (38.9 C) or greater.  You develop worsening pain at or near the incision site. MAKE SURE YOU:   Understand these instructions.  Will watch your condition.  Will get help right away if you are not doing well or get worse. Document Released: 07/30/2006 Document Revised: 06/11/2011 Document Reviewed: 09/30/2006 [employer] Patient Information 2014 [place], [place].   ________________________________________________________________________

## 2018-05-06 ENCOUNTER — Ambulatory Visit (HOSPITAL_COMMUNITY)
Admission: RE | Admit: 2018-05-06 | Discharge: 2018-05-06 | Disposition: A | Discharge status: Home / Self Care | Payer: Medicare Other | Source: Ambulatory Visit | Attending: Orthopedic Surgery | Admitting: Orthopedic Surgery

## 2018-05-06 ENCOUNTER — Other Ambulatory Visit: Payer: Self-pay

## 2018-05-06 ENCOUNTER — Encounter (HOSPITAL_COMMUNITY)
Admission: RE | Admit: 2018-05-06 | Discharge: 2018-05-06 | Disposition: A | Discharge status: Home / Self Care | Payer: Medicare Other | Source: Ambulatory Visit | Attending: Orthopedic Surgery | Admitting: Orthopedic Surgery

## 2018-05-06 ENCOUNTER — Encounter (HOSPITAL_COMMUNITY): Payer: Self-pay

## 2018-05-06 DIAGNOSIS — Z01818 Encounter for other preprocedural examination: Secondary | ICD-10-CM | POA: Diagnosis not present

## 2018-05-06 DIAGNOSIS — R7303 Prediabetes: Secondary | ICD-10-CM | POA: Diagnosis not present

## 2018-05-06 DIAGNOSIS — Z01811 Encounter for preprocedural respiratory examination: Secondary | ICD-10-CM

## 2018-05-06 DIAGNOSIS — I10 Essential (primary) hypertension: Secondary | ICD-10-CM | POA: Diagnosis not present

## 2018-05-06 DIAGNOSIS — M1711 Unilateral primary osteoarthritis, right knee: Secondary | ICD-10-CM | POA: Diagnosis not present

## 2018-05-06 DIAGNOSIS — M25561 Pain in right knee: Secondary | ICD-10-CM | POA: Diagnosis present

## 2018-05-06 DIAGNOSIS — Z79899 Other long term (current) drug therapy: Secondary | ICD-10-CM | POA: Diagnosis not present

## 2018-05-06 DIAGNOSIS — Z96651 Presence of right artificial knee joint: Secondary | ICD-10-CM | POA: Diagnosis not present

## 2018-05-06 HISTORY — DX: Prediabetes: R73.03

## 2018-05-06 HISTORY — DX: Essential (primary) hypertension: I10

## 2018-05-06 HISTORY — DX: Unspecified osteoarthritis, unspecified site: M19.90

## 2018-05-06 LAB — URINALYSIS, ROUTINE W REFLEX MICROSCOPIC
Bilirubin Urine: NEGATIVE
Glucose, UA: NEGATIVE mg/dL
Hgb urine dipstick: NEGATIVE
Ketones, ur: NEGATIVE mg/dL
Leukocytes, UA: NEGATIVE
NITRITE: NEGATIVE
Protein, ur: NEGATIVE mg/dL
Specific Gravity, Urine: 1.023 (ref 1.005–1.030)
pH: 5 (ref 5.0–8.0)

## 2018-05-06 LAB — CBC WITH DIFFERENTIAL/PLATELET
Abs Immature Granulocytes: 0.01 10*3/uL (ref 0.00–0.07)
Basophils Absolute: 0 10*3/uL (ref 0.0–0.1)
Basophils Relative: 0 %
Eosinophils Absolute: 0.1 10*3/uL (ref 0.0–0.5)
Eosinophils Relative: 3 %
HEMATOCRIT: 45.3 % (ref 39.0–52.0)
Hemoglobin: 15.1 g/dL (ref 13.0–17.0)
Immature Granulocytes: 0 %
Lymphocytes Relative: 58 %
Lymphs Abs: 2.6 10*3/uL (ref 0.7–4.0)
MCH: 29 pg (ref 26.0–34.0)
MCHC: 33.3 g/dL (ref 30.0–36.0)
MCV: 86.9 fL (ref 80.0–100.0)
Monocytes Absolute: 0.3 10*3/uL (ref 0.1–1.0)
Monocytes Relative: 7 %
Neutro Abs: 1.4 10*3/uL — ABNORMAL LOW (ref 1.7–7.7)
Neutrophils Relative %: 32 %
Platelets: 273 10*3/uL (ref 150–400)
RBC: 5.21 MIL/uL (ref 4.22–5.81)
RDW: 13.2 % (ref 11.5–15.5)
WBC: 4.4 10*3/uL (ref 4.0–10.5)
nRBC: 0 % (ref 0.0–0.2)

## 2018-05-06 LAB — COMPREHENSIVE METABOLIC PANEL
ALT: 28 U/L (ref 0–44)
AST: 27 U/L (ref 15–41)
Albumin: 4.7 g/dL (ref 3.5–5.0)
Alkaline Phosphatase: 53 U/L (ref 38–126)
Anion gap: 8 (ref 5–15)
BUN: 19 mg/dL (ref 8–23)
CALCIUM: 9.5 mg/dL (ref 8.9–10.3)
CO2: 23 mmol/L (ref 22–32)
CREATININE: 1.06 mg/dL (ref 0.61–1.24)
Chloride: 108 mmol/L (ref 98–111)
GFR calc Af Amer: >60 mL/min (ref >60)
GFR calc non Af Amer: >60 mL/min (ref >60)
Glucose, Bld: 139 mg/dL — ABNORMAL HIGH (ref 70–99)
Potassium: 4.1 mmol/L (ref 3.5–5.1)
Sodium: 139 mmol/L (ref 135–145)
Total Bilirubin: 0.6 mg/dL (ref 0.3–1.2)
Total Protein: 8.3 g/dL — ABNORMAL HIGH (ref 6.5–8.1)

## 2018-05-06 LAB — HEMOGLOBIN A1C
Hgb A1c MFr Bld: 6.6 % — ABNORMAL HIGH (ref 4.8–5.6)
Mean Plasma Glucose: 142.72 mg/dL

## 2018-05-06 LAB — SURGICAL PCR SCREEN
MRSA, PCR: NEGATIVE
Staphylococcus aureus: POSITIVE — AB

## 2018-05-06 LAB — APTT: APTT: 27 s (ref 24–36)

## 2018-05-06 LAB — ABO/RH: ABO/RH(D): O POS

## 2018-05-06 LAB — PROTIME-INR
INR: 1.09
Prothrombin Time: 14 seconds (ref 11.4–15.2)

## 2018-05-08 MED ORDER — BUPIVACAINE LIPOSOME 1.3 % IJ SUSP
20.0000 mL | Freq: Once | INTRAMUSCULAR | Status: DC
  Filled 2018-05-08: qty 20

## 2018-05-08 NOTE — Progress Notes (Signed)
Called patient and informed him to arrive at [facility] at 0830 am on 05/09/2018 for his surgery. Instructed patient to follow all other instructions he received at his pre-op appointment. Patient instructed to have nothing to eat or drink after midnight. Patient verbalized understanding.

## 2018-05-09 ENCOUNTER — Encounter (HOSPITAL_COMMUNITY)
Admission: RE | Disposition: A | Discharge status: Home Health | Payer: Self-pay | Source: Home / Self Care | Attending: Orthopedic Surgery

## 2018-05-09 ENCOUNTER — Ambulatory Visit (HOSPITAL_COMMUNITY): Payer: Medicare Other | Admitting: Anesthesiology

## 2018-05-09 ENCOUNTER — Inpatient Hospital Stay (HOSPITAL_COMMUNITY)
Admission: RE | Admit: 2018-05-09 | Discharge: 2018-05-10 | DRG: 470 | Disposition: A | Discharge status: Home Health | Payer: Medicare Other | Attending: Orthopedic Surgery | Admitting: Orthopedic Surgery

## 2018-05-09 ENCOUNTER — Other Ambulatory Visit: Payer: Self-pay

## 2018-05-09 ENCOUNTER — Encounter (HOSPITAL_COMMUNITY): Payer: Self-pay | Admitting: *Deleted

## 2018-05-09 ENCOUNTER — Ambulatory Visit (HOSPITAL_COMMUNITY): Payer: Medicare Other | Admitting: Physician Assistant

## 2018-05-09 DIAGNOSIS — Z79899 Other long term (current) drug therapy: Secondary | ICD-10-CM | POA: Diagnosis not present

## 2018-05-09 DIAGNOSIS — R7303 Prediabetes: Secondary | ICD-10-CM | POA: Diagnosis present

## 2018-05-09 DIAGNOSIS — I10 Essential (primary) hypertension: Secondary | ICD-10-CM | POA: Diagnosis not present

## 2018-05-09 DIAGNOSIS — M25561 Pain in right knee: Secondary | ICD-10-CM | POA: Diagnosis not present

## 2018-05-09 DIAGNOSIS — M1711 Unilateral primary osteoarthritis, right knee: Principal | ICD-10-CM | POA: Diagnosis present

## 2018-05-09 DIAGNOSIS — G8918 Other acute postprocedural pain: Secondary | ICD-10-CM | POA: Diagnosis not present

## 2018-05-09 HISTORY — PX: TOTAL KNEE ARTHROPLASTY: SHX125

## 2018-05-09 LAB — TYPE AND SCREEN
ABO/RH(D): O POS
Antibody Screen: NEGATIVE

## 2018-05-09 LAB — GLUCOSE, CAPILLARY: GLUCOSE-CAPILLARY: 112 mg/dL — AB (ref 70–99)

## 2018-05-09 SURGERY — ARTHROPLASTY, KNEE, TOTAL
Anesthesia: Spinal | Site: Knee | Laterality: Right

## 2018-05-09 MED ORDER — DEXAMETHASONE SODIUM PHOSPHATE 10 MG/ML IJ SOLN
10.0000 mg | Freq: Two times a day (BID) | INTRAMUSCULAR | Status: DC
  Administered 2018-05-09 – 2018-05-10 (×2): 10 mg via INTRAVENOUS
  Filled 2018-05-09 (×2): qty 1

## 2018-05-09 MED ORDER — BUPIVACAINE HCL (PF) 0.5 % IJ SOLN
INTRAMUSCULAR | Status: AC
  Filled 2018-05-09: qty 30

## 2018-05-09 MED ORDER — ONDANSETRON HCL 4 MG/2ML IJ SOLN: 4.0000 mg | Freq: Four times a day (QID) | INTRAMUSCULAR | Status: DC | PRN

## 2018-05-09 MED ORDER — AMLODIPINE BESYLATE 5 MG PO TABS
5.0000 mg | ORAL_TABLET | Freq: Every day | ORAL | Status: DC
  Administered 2018-05-10: 5 mg via ORAL
  Filled 2018-05-09: qty 1

## 2018-05-09 MED ORDER — SODIUM CHLORIDE 0.9 % IV SOLN
INTRAVENOUS | Status: DC
  Administered 2018-05-09: 18:00:00 via INTRAVENOUS

## 2018-05-09 MED ORDER — SODIUM CHLORIDE (PF) 0.9 % IJ SOLN
INTRAMUSCULAR | Status: AC
  Filled 2018-05-09: qty 50

## 2018-05-09 MED ORDER — POLYETHYLENE GLYCOL 3350 17 G PO PACK: 17.0000 g | PACK | Freq: Every day | ORAL | Status: DC | PRN

## 2018-05-09 MED ORDER — ONDANSETRON HCL 4 MG/2ML IJ SOLN
INTRAMUSCULAR | Status: DC | PRN
  Administered 2018-05-09: 4 mg via INTRAVENOUS

## 2018-05-09 MED ORDER — BUPIVACAINE HCL 0.5 % IJ SOLN
INTRAMUSCULAR | Status: DC | PRN
  Administered 2018-05-09: 30 mL

## 2018-05-09 MED ORDER — OXYCODONE-ACETAMINOPHEN 5-325 MG PO TABS: 1.0000 | ORAL_TABLET | Freq: Four times a day (QID) | ORAL | 0 refills | Status: AC | PRN

## 2018-05-09 MED ORDER — PROPOFOL 10 MG/ML IV BOLUS
INTRAVENOUS | Status: AC
  Filled 2018-05-09: qty 20

## 2018-05-09 MED ORDER — SODIUM CHLORIDE 0.9% FLUSH
INTRAVENOUS | Status: DC | PRN
  Administered 2018-05-09: 50 mL

## 2018-05-09 MED ORDER — EPHEDRINE 5 MG/ML INJ
INTRAVENOUS | Status: AC
  Filled 2018-05-09: qty 10

## 2018-05-09 MED ORDER — DOCUSATE SODIUM 100 MG PO CAPS: 100.0000 mg | ORAL_CAPSULE | Freq: Two times a day (BID) | ORAL | 0 refills | Status: AC

## 2018-05-09 MED ORDER — METHOCARBAMOL 500 MG PO TABS
500.0000 mg | ORAL_TABLET | Freq: Four times a day (QID) | ORAL | Status: DC | PRN
  Administered 2018-05-09 – 2018-05-10 (×2): 500 mg via ORAL
  Filled 2018-05-09 (×2): qty 1

## 2018-05-09 MED ORDER — CEFAZOLIN SODIUM-DEXTROSE 2-4 GM/100ML-% IV SOLN
2.0000 g | Freq: Four times a day (QID) | INTRAVENOUS | Status: AC
  Administered 2018-05-09 (×2): 2 g via INTRAVENOUS
  Filled 2018-05-09 (×2): qty 100

## 2018-05-09 MED ORDER — WATER FOR IRRIGATION, STERILE IR SOLN
Status: DC | PRN
  Administered 2018-05-09: 2000 mL

## 2018-05-09 MED ORDER — CEFAZOLIN SODIUM-DEXTROSE 2-4 GM/100ML-% IV SOLN
2.0000 g | INTRAVENOUS | Status: AC
  Administered 2018-05-09: 2 g via INTRAVENOUS
  Filled 2018-05-09: qty 100

## 2018-05-09 MED ORDER — PROPOFOL 500 MG/50ML IV EMUL
INTRAVENOUS | Status: DC | PRN
  Administered 2018-05-09: 100 ug/kg/min via INTRAVENOUS

## 2018-05-09 MED ORDER — ROPIVACAINE HCL 7.5 MG/ML IJ SOLN
INTRAMUSCULAR | Status: DC | PRN
  Administered 2018-05-09: 20 mL via PERINEURAL

## 2018-05-09 MED ORDER — OXYCODONE HCL 5 MG PO TABS
ORAL_TABLET | ORAL | Status: AC
  Filled 2018-05-09: qty 1

## 2018-05-09 MED ORDER — LACTATED RINGERS IV SOLN
INTRAVENOUS | Status: DC
  Administered 2018-05-09 (×2): via INTRAVENOUS

## 2018-05-09 MED ORDER — SODIUM CHLORIDE 0.9 % IR SOLN
Status: DC | PRN
  Administered 2018-05-09 (×2): 1000 mL

## 2018-05-09 MED ORDER — DOCUSATE SODIUM 100 MG PO CAPS
100.0000 mg | ORAL_CAPSULE | Freq: Two times a day (BID) | ORAL | Status: DC
  Administered 2018-05-09 – 2018-05-10 (×2): 100 mg via ORAL
  Filled 2018-05-09 (×2): qty 1

## 2018-05-09 MED ORDER — TIZANIDINE HCL 2 MG PO TABS: 2.0000 mg | ORAL_TABLET | Freq: Three times a day (TID) | ORAL | 0 refills | Status: AC | PRN

## 2018-05-09 MED ORDER — EPHEDRINE SULFATE-NACL 50-0.9 MG/10ML-% IV SOSY
PREFILLED_SYRINGE | INTRAVENOUS | Status: DC | PRN
  Administered 2018-05-09: 5 mg via INTRAVENOUS
  Administered 2018-05-09: 10 mg via INTRAVENOUS
  Administered 2018-05-09: 5 mg via INTRAVENOUS

## 2018-05-09 MED ORDER — ASPIRIN EC 325 MG PO TBEC
325.0000 mg | DELAYED_RELEASE_TABLET | Freq: Two times a day (BID) | ORAL | Status: DC
  Administered 2018-05-10: 325 mg via ORAL
  Filled 2018-05-09: qty 1

## 2018-05-09 MED ORDER — OXYCODONE HCL 5 MG PO TABS
5.0000 mg | ORAL_TABLET | Freq: Once | ORAL | Status: AC | PRN
  Administered 2018-05-09: 5 mg via ORAL

## 2018-05-09 MED ORDER — FENTANYL CITRATE (PF) 100 MCG/2ML IJ SOLN: 25.0000 ug | INTRAMUSCULAR | Status: DC | PRN

## 2018-05-09 MED ORDER — ASPIRIN EC 325 MG PO TBEC: 325.0000 mg | DELAYED_RELEASE_TABLET | Freq: Two times a day (BID) | ORAL | 0 refills | Status: AC

## 2018-05-09 MED ORDER — GABAPENTIN 300 MG PO CAPS
300.0000 mg | ORAL_CAPSULE | Freq: Two times a day (BID) | ORAL | Status: DC
  Administered 2018-05-09 – 2018-05-10 (×2): 300 mg via ORAL
  Filled 2018-05-09 (×2): qty 1

## 2018-05-09 MED ORDER — BISACODYL 5 MG PO TBEC: 5.0000 mg | DELAYED_RELEASE_TABLET | Freq: Every day | ORAL | Status: DC | PRN

## 2018-05-09 MED ORDER — FENTANYL CITRATE (PF) 100 MCG/2ML IJ SOLN
50.0000 ug | INTRAMUSCULAR | Status: DC
  Administered 2018-05-09 (×2): 50 ug via INTRAVENOUS
  Filled 2018-05-09: qty 2

## 2018-05-09 MED ORDER — BUPIVACAINE IN DEXTROSE 0.75-8.25 % IT SOLN
INTRATHECAL | Status: DC | PRN
  Administered 2018-05-09: 1.6 mL via INTRATHECAL

## 2018-05-09 MED ORDER — TRANEXAMIC ACID-NACL 1000-0.7 MG/100ML-% IV SOLN
1000.0000 mg | Freq: Once | INTRAVENOUS | Status: AC
  Administered 2018-05-09: 1000 mg via INTRAVENOUS
  Filled 2018-05-09: qty 100

## 2018-05-09 MED ORDER — DIPHENHYDRAMINE HCL 12.5 MG/5ML PO ELIX: 12.5000 mg | ORAL_SOLUTION | ORAL | Status: DC | PRN

## 2018-05-09 MED ORDER — TRANEXAMIC ACID-NACL 1000-0.7 MG/100ML-% IV SOLN
1000.0000 mg | INTRAVENOUS | Status: DC
  Filled 2018-05-09: qty 100

## 2018-05-09 MED ORDER — BUPIVACAINE-EPINEPHRINE 0.5% -1:200000 IJ SOLN: INTRAMUSCULAR | Status: DC | PRN

## 2018-05-09 MED ORDER — CHLORHEXIDINE GLUCONATE 4 % EX LIQD: 60.0000 mL | Freq: Once | CUTANEOUS | Status: DC

## 2018-05-09 MED ORDER — OXYCODONE HCL 5 MG/5ML PO SOLN
5.0000 mg | Freq: Once | ORAL | Status: AC | PRN
  Filled 2018-05-09: qty 5

## 2018-05-09 MED ORDER — ACETAMINOPHEN 325 MG PO TABS
325.0000 mg | ORAL_TABLET | Freq: Four times a day (QID) | ORAL | Status: DC | PRN
  Administered 2018-05-09 – 2018-05-10 (×2): 650 mg via ORAL
  Filled 2018-05-09 (×2): qty 2

## 2018-05-09 MED ORDER — ONDANSETRON HCL 4 MG PO TABS: 4.0000 mg | ORAL_TABLET | Freq: Four times a day (QID) | ORAL | Status: DC | PRN

## 2018-05-09 MED ORDER — BUPIVACAINE LIPOSOME 1.3 % IJ SUSP
INTRAMUSCULAR | Status: DC | PRN
  Administered 2018-05-09: 20 mL

## 2018-05-09 MED ORDER — MIDAZOLAM HCL 2 MG/2ML IJ SOLN
1.0000 mg | INTRAMUSCULAR | Status: DC
  Administered 2018-05-09 (×2): 1 mg via INTRAVENOUS
  Filled 2018-05-09: qty 2

## 2018-05-09 MED ORDER — MAGNESIUM CITRATE PO SOLN: 1.0000 | Freq: Once | ORAL | Status: DC | PRN

## 2018-05-09 MED ORDER — METHOCARBAMOL 500 MG IVPB - SIMPLE MED
500.0000 mg | Freq: Four times a day (QID) | INTRAVENOUS | Status: DC | PRN
  Filled 2018-05-09: qty 50

## 2018-05-09 MED ORDER — ONDANSETRON HCL 4 MG/2ML IJ SOLN: 4.0000 mg | Freq: Once | INTRAMUSCULAR | Status: DC | PRN

## 2018-05-09 MED ORDER — PHENYLEPHRINE 40 MCG/ML (10ML) SYRINGE FOR IV PUSH (FOR BLOOD PRESSURE SUPPORT)
PREFILLED_SYRINGE | INTRAVENOUS | Status: AC
  Filled 2018-05-09: qty 10

## 2018-05-09 MED ORDER — PHENYLEPHRINE 40 MCG/ML (10ML) SYRINGE FOR IV PUSH (FOR BLOOD PRESSURE SUPPORT)
PREFILLED_SYRINGE | INTRAVENOUS | Status: DC | PRN
  Administered 2018-05-09 (×2): 40 ug via INTRAVENOUS

## 2018-05-09 MED ORDER — HYDROMORPHONE HCL 1 MG/ML IJ SOLN: 0.5000 mg | INTRAMUSCULAR | Status: DC | PRN

## 2018-05-09 MED ORDER — ONDANSETRON HCL 4 MG/2ML IJ SOLN
INTRAMUSCULAR | Status: AC
  Filled 2018-05-09: qty 2

## 2018-05-09 MED ORDER — SUGAMMADEX SODIUM 200 MG/2ML IV SOLN
INTRAVENOUS | Status: AC
  Filled 2018-05-09: qty 2

## 2018-05-09 MED ORDER — PROPOFOL 10 MG/ML IV BOLUS
INTRAVENOUS | Status: AC
  Filled 2018-05-09: qty 40

## 2018-05-09 MED ORDER — CELECOXIB 200 MG PO CAPS
200.0000 mg | ORAL_CAPSULE | Freq: Two times a day (BID) | ORAL | Status: DC
  Administered 2018-05-09 – 2018-05-10 (×2): 200 mg via ORAL
  Filled 2018-05-09 (×2): qty 1

## 2018-05-09 MED ORDER — ALUM & MAG HYDROXIDE-SIMETH 200-200-20 MG/5ML PO SUSP: 30.0000 mL | ORAL | Status: DC | PRN

## 2018-05-09 MED ORDER — OXYCODONE HCL 5 MG PO TABS
5.0000 mg | ORAL_TABLET | ORAL | Status: DC | PRN
  Administered 2018-05-09 – 2018-05-10 (×4): 10 mg via ORAL
  Filled 2018-05-09 (×4): qty 2

## 2018-05-09 SURGICAL SUPPLY — 60 items
APL SKNCLS STERI-STRIP NONHPOA (GAUZE/BANDAGES/DRESSINGS) ×1
ATTUNE MED DOME PAT 41 KNEE (Knees) ×1 IMPLANT
ATTUNE MED DOME PAT 41MM KNEE (Knees) ×1 IMPLANT
ATTUNE PS FEM RT SZ 7 CEM KNEE (Femur) ×2 IMPLANT
ATTUNE PSRP INSR SZ7 5 KNEE (Insert) ×1 IMPLANT
ATTUNE PSRP INSR SZ7 5MM KNEE (Insert) ×1 IMPLANT
BAG SPEC THK2 15X12 ZIP CLS (MISCELLANEOUS) ×1
BAG ZIPLOCK 12X15 (MISCELLANEOUS) ×3 IMPLANT
BANDAGE ACE 6X5 VEL STRL LF (GAUZE/BANDAGES/DRESSINGS) ×3 IMPLANT
BANDAGE ELASTIC 6 VELCRO ST LF (GAUZE/BANDAGES/DRESSINGS) ×2 IMPLANT
BASE TIBIAL ROT PLAT SZ 8 KNEE (Knees) IMPLANT
BENZOIN TINCTURE PRP APPL 2/3 (GAUZE/BANDAGES/DRESSINGS) ×3 IMPLANT
BLADE SAGITTAL 25.0X1.19X90 (BLADE) ×2 IMPLANT
BLADE SAGITTAL 25.0X1.19X90MM (BLADE) ×1
BLADE SAW SGTL 11.0X1.19X90.0M (BLADE) ×3 IMPLANT
BLADE SURG SZ10 CARB STEEL (BLADE) ×6 IMPLANT
BOOTIES KNEE HIGH SLOAN (MISCELLANEOUS) ×3 IMPLANT
BOWL SMART MIX CTS (DISPOSABLE) ×3 IMPLANT
BSPLAT TIB 8 CMNT ROT PLAT STR (Knees) ×1 IMPLANT
CEMENT HV SMART SET (Cement) ×6 IMPLANT
CLOSURE WOUND 1/2 X4 (GAUZE/BANDAGES/DRESSINGS) ×1
COVER SURGICAL LIGHT HANDLE (MISCELLANEOUS) ×3 IMPLANT
COVER WAND RF STERILE (DRAPES) IMPLANT
CUFF TOURN SGL QUICK 34 (TOURNIQUET CUFF) ×3
CUFF TRNQT CYL 34X4X40X1 (TOURNIQUET CUFF) ×1 IMPLANT
DECANTER SPIKE VIAL GLASS SM (MISCELLANEOUS) ×4 IMPLANT
DRAPE U-SHAPE 47X51 STRL (DRAPES) ×3 IMPLANT
DRSG AQUACEL AG ADV 3.5X10 (GAUZE/BANDAGES/DRESSINGS) ×3 IMPLANT
DURAPREP 26ML APPLICATOR (WOUND CARE) ×3 IMPLANT
ELECT REM PT RETURN 15FT ADLT (MISCELLANEOUS) ×3 IMPLANT
GLOVE BIOGEL PI IND STRL 8 (GLOVE) ×2 IMPLANT
GLOVE BIOGEL PI INDICATOR 8 (GLOVE) ×4
GLOVE ECLIPSE 7.5 STRL STRAW (GLOVE) ×6 IMPLANT
GOWN STRL REUS W/TWL XL LVL3 (GOWN DISPOSABLE) ×6 IMPLANT
HANDPIECE INTERPULSE COAX TIP (DISPOSABLE) ×3
HOLDER FOLEY CATH W/STRAP (MISCELLANEOUS) IMPLANT
HOOD PEEL AWAY FLYTE STAYCOOL (MISCELLANEOUS) ×9 IMPLANT
IMMOBILIZER KNEE 20 (SOFTGOODS) ×5 IMPLANT
IMMOBILIZER KNEE 20 THIGH 36 (SOFTGOODS) ×1 IMPLANT
MANIFOLD NEPTUNE II (INSTRUMENTS) ×3 IMPLANT
NEEDLE HYPO 22GX1.5 SAFETY (NEEDLE) ×3 IMPLANT
NS IRRIG 1000ML POUR BTL (IV SOLUTION) ×3 IMPLANT
PACK ICE MAXI GEL EZY WRAP (MISCELLANEOUS) ×3 IMPLANT
PACK TOTAL KNEE CUSTOM (KITS) ×3 IMPLANT
PADDING CAST COTTON 6X4 STRL (CAST SUPPLIES) ×3 IMPLANT
PIN STEINMAN FIXATION KNEE (PIN) ×2 IMPLANT
PROTECTOR NERVE ULNAR (MISCELLANEOUS) ×3 IMPLANT
SET HNDPC FAN SPRY TIP SCT (DISPOSABLE) ×1 IMPLANT
STRIP CLOSURE SKIN 1/2X4 (GAUZE/BANDAGES/DRESSINGS) ×1 IMPLANT
SUT MNCRL AB 3-0 PS2 18 (SUTURE) ×3 IMPLANT
SUT VIC AB 0 CT1 36 (SUTURE) ×3 IMPLANT
SUT VIC AB 1 CT1 36 (SUTURE) ×6 IMPLANT
SUT VIC AB 2-0 CT1 27 (SUTURE) ×3
SUT VIC AB 2-0 CT1 TAPERPNT 27 (SUTURE) ×1 IMPLANT
SYR CONTROL 10ML LL (SYRINGE) ×6 IMPLANT
TIBIAL BASE ROT PLAT SZ 8 KNEE (Knees) ×3 IMPLANT
TRAY FOLEY MTR SLVR 16FR STAT (SET/KITS/TRAYS/PACK) ×3 IMPLANT
WATER STERILE IRR 1000ML POUR (IV SOLUTION) ×6 IMPLANT
WRAP KNEE MAXI GEL POST OP (GAUZE/BANDAGES/DRESSINGS) ×2 IMPLANT
YANKAUER SUCT BULB TIP 10FT TU (MISCELLANEOUS) ×3 IMPLANT

## 2018-05-09 NOTE — Anesthesia Postprocedure Evaluation (Signed)
Anesthesia Post Note  Patient: Dean Williams  Procedure(s) Performed: TOTAL KNEE ARTHROPLASTY (Right Knee)     Patient location during evaluation: PACU Anesthesia Type: Spinal Level of consciousness: awake and alert Pain management: pain level controlled Vital Signs Assessment: post-procedure vital signs reviewed and stable Respiratory status: spontaneous breathing and respiratory function stable Cardiovascular status: blood pressure returned to baseline and stable Postop Assessment: spinal receding and no apparent nausea or vomiting Anesthetic complications: no    Last Vitals:  Vitals:   05/09/18 1300 05/09/18 1315  BP: 119/73   Pulse: 70 67  Resp: 20 18  Temp:    SpO2: 100% 100%    Last Pain:  Vitals:   05/09/18 1315  TempSrc:   PainSc: (P) 0-No pain                 Audry Pili

## 2018-05-09 NOTE — Brief Op Note (Signed)
05/09/2018  1:58 PM  PATIENT:  Dean Williams  68 y.o. male  PRE-OPERATIVE DIAGNOSIS:  BILATERAL KNEE OSTEOARTHRITIS  POST-OPERATIVE DIAGNOSIS:  BILATERAL KNEE OSTEOARTHRITIS  PROCEDURE:  Procedure(s): TOTAL KNEE ARTHROPLASTY (Right)  SURGEON:  Surgeon(s) and Role:    Dorna Leitz, MD - Primary  PHYSICIAN ASSISTANT:   ASSISTANTS: bethune   ANESTHESIA:   spinal  EBL:  30 mL   BLOOD ADMINISTERED:none  DRAINS: none   LOCAL MEDICATIONS USED:  MARCAINE    and OTHER experel   SPECIMEN:  No Specimen  DISPOSITION OF SPECIMEN:  N/A  COUNTS:  YES  TOURNIQUET:   Total Tourniquet Time Documented: Thigh (Right) - 62 minutes Total: Thigh (Right) - 62 minutes   DICTATION: .Other Dictation: Dictation Number [id number]  PLAN OF CARE: Admit for overnight observation  PATIENT DISPOSITION:  PACU - hemodynamically stable.   Delay start of Pharmacological VTE agent (>24hrs) due to surgical blood loss or risk of bleeding: no

## 2018-05-09 NOTE — Care Plan (Signed)
Ortho Bundle Case Management Note  Patient Details  Name: Dean Williams MRN: [id number] Date of Birth: [birth year]     Spoke with patient prior to surgery. He will discharge to home with family to assist. I have ordered rolling walker and CPM to be delivered to him. [employer] will try to deliver prior to surgery if possible. He will transition to OPPT as appropriate. He is aware and agreeable with this plan.                DME Arranged:  CPM, Walker rolling DME Agency:  [employer]  HH Arranged:  PT [place] Agency:  [facility]  Additional Comments: Please contact me with any questions of if this plan should need to change.  Ladell Heads,  [place] [facility]ist  [phone] 05/09/2018, 10:31 AM

## 2018-05-09 NOTE — Anesthesia Procedure Notes (Signed)
Anesthesia Regional Block: Adductor canal block   Pre-Anesthetic Checklist: ,, timeout performed, Correct Patient, Correct Site, Correct Laterality, Correct Procedure, Correct Position, site marked, Risks and benefits discussed,  Surgical consent,  Pre-op evaluation,  At surgeon's request and post-op pain management  Laterality: Right  Prep: chloraprep       Needles:  Injection technique: Single-shot  Needle Type: Echogenic Needle     Needle Length: 10cm  Needle Gauge: 21     Additional Needles:   Narrative:  Start time: 05/09/2018 9:29 AM End time: 05/09/2018 9:32 AM Injection made incrementally with aspirations every 5 mL.  Performed by: Personally  Anesthesiologist: Audry Pili, MD  Additional Notes: No pain on injection. No increased resistance to injection. Injection made in 5cc increments. Good needle visualization. Patient tolerated the procedure well.

## 2018-05-09 NOTE — Op Note (Signed)
NAME: Dean Williams, Dean Williams MEDICAL RECORD [id number] ACCOUNT [id number] DATE OF BIRTH:Oct 27, 1950 FACILITY: [facility] LOCATION: [id number] PHYSICIAN:Naman Spychalski L. Xavius Spadafore, MD  OPERATIVE REPORT  DATE OF PROCEDURE:  05/09/2018  PREOPERATIVE DIAGNOSIS:  End-stage degenerative joint disease, right knee, with severe bone-on-bone change in a patient who is having night pain and light activity pain.  POSTOPERATIVE DIAGNOSIS:  End-stage degenerative joint disease, right knee, with severe bone-on-bone change in a patient who is having night pain and light activity pain.  PROCEDURE:  Right total knee replacement with an Attune system, size 7 femur, size 8 tibia, 5 mm bridging bearing, and a 41 mm all polyethylene patella.  The insert was a size 7.  SURGEON:  Dorna Leitz, MD  ASSISTANT:  Gaspar Skeeters. PA-C.  He was present for the entire case and assisted by retraction, bone cuts, and closing to minimize OR time.  BRIEF HISTORY:  The patient is a 68 year old male with a long history of significant complaints of bilateral knee pain.  X-ray shows severe bone-on-bone changes, having night pain and light activity pain.  After failure of all conservative care including  activity modification, injection therapy, he was taken to the operating room for right total knee replacement.  DESCRIPTION OF PROCEDURE:  The patient was taken to the operating room after adequate anesthesia was obtained with general anesthetic.  The patient was placed supine on the operating table.  The right leg was prepped and draped in the usual sterile  fashion.  Following this, an incision was made from the midline of the right knee.  Subcutaneous tissue was dissected down to the level of extensor mechanism.  A medial parapatellar arthrotomy was undertaken.  Following this, a medial release was  performed, and the attention was turned to the knee where the medial and lateral menisci were removed, retropatellar fat pad, synovium on the anterior  aspect of the femur, and the anterior and posterior cruciates.  Once this was done, an intramedullary  pilot hole was drilled, and the femur was cut with a 4-degree valgus inclination and with 9 mm of distal bone.  Once this was completed, attention turned towards sizing the femur.  It sizes to a 7.  Anterior and posterior cuts were made, chamfers and  box.  Attention was then turned to the tibia.  It was cut perpendicular to its long axis with 3-degree posterior slope and the femur sized to an 8.  It was drilled and keeled.  Attention was turned to putting in the trial poly and 5 was placed.   Excellent range of motion and stability were achieved.  Attention was turned to the patella.  It was cut down to 9.5 mm of patellar resected, and a 41 paddle was chosen and lugs were drilled for the patella.  The knee was put through a range of motion.   Excellent stability and range of motion were achieved.  At this point once this was completed, the trial components were removed.  The knee was copiously and thoroughly lavaged, pulsatile lavage irrigation, and suctioned dry.  The final components were  cemented into place, size 8 tibia, size 7 femur, 5 mm bridging bearing, and a 41 all poly patella was placed and held with a clamp.  Once the bone cement was allowed to completely harden, all excess bone cement was removed.  The tourniquet was let down,  all bleeders controlled with electrocautery.  Final range of motion and stability were checked.  Satisfied with the 5 poly, it was opened and  placed.  At this point, the knee was again irrigated and suction dried.  The medial arthrotomy was closed with 1  Vicryl running, the skin with 0 and 2-0 Vicryl and 3-0 Monocryl subcuticular.  Benzoin and Steri-Strips were applied.  A sterile compressive dressing was applied.  The patient was taken to recovery and was noted to be in satisfactory condition.   Estimated blood loss for the procedure was minimal.  LN/NUANCE   D:05/09/2018 T:05/09/2018 JOB:[id number]

## 2018-05-09 NOTE — Evaluation (Signed)
Physical Therapy Evaluation Patient Details Name: Dean Williams MRN: [id number] DOB: [birth year] Today's Date: 05/09/2018   History of Present Illness  68 yo male s/p R TKR on 05/09/18. PMH includes OA, HTN, pre-DM, R knee arthroscopy, L carpal tunnel release.   Clinical Impression  Pt presents with R knee pain, decreased R knee ROM, difficulty performing mobility tasks, decreased tolerance to ambulation due to pain and suspected vagal response. Pt to benefit from acute PT to address deficits. Pt ambulated 15 ft with RW with verbal cuing for form and safety, ambulation terminated when pt reported feeling dizzy and nauseous. BP and HR 79/45, 44 bpm. RN notified, pt returned to bed. Pt educated on ankle pumps (20/hour) to perform this afternoon/evening to increase circulation, to pt's tolerance and limited by pain. PT to progress mobility as tolerated, and will continue to follow acutely.        Follow Up Recommendations Follow surgeon's recommendation for DC plan and follow-up therapies;Supervision for mobility/OOB    Equipment Recommendations  None recommended by PT    Recommendations for Other Services       Precautions / Restrictions Precautions Precautions: Fall Restrictions Weight Bearing Restrictions: No Other Position/Activity Restrictions: WBAT       Mobility  Bed Mobility Overal bed mobility: Needs Assistance Bed Mobility: Supine to Sit;Sit to Supine     Supine to sit: HOB elevated;Min guard Sit to supine: Min assist;HOB elevated   General bed mobility comments: Min guard for supine to sit for RLE lift and translation to EOB. Min assist for sit to supine for RLE lifting and placement in bed. Pt mod I in scooting self up in bed.   Transfers Overall transfer level: Needs assistance Equipment used: Rolling walker (2 wheeled) Transfers: Sit to/from Stand Sit to Stand: Min guard;From elevated surface         General transfer comment: Min guard for safety. Sit  to stand x2, once from bed and once from recliner after ambulation. VC for hand placement.   Ambulation/Gait Ambulation/Gait assistance: Min guard Gait Distance (Feet): 15 Feet Assistive device: Rolling walker (2 wheeled) Gait Pattern/deviations: Step-to pattern;Decreased stance time - right;Decreased weight shift to right;Antalgic Gait velocity: decr    General Gait Details: Min guard for safety. Verbal cuing for placement in RW, sequencing with step-to gait, and turning with RW. After 15 ft ambulation, pt reporting feeling dizzy. PT aide quickly brought recliner and pt sat, pt reclined. BP and HR initially taken 79/45 and 44 bpm respectively. RN took vitals after this to verify, BP and HR 92/58 and 50s bpm.   Stairs            Wheelchair Mobility    Modified Rankin (Stroke Patients Only)       Balance Overall balance assessment: Mild deficits observed, not formally tested                                           Pertinent Vitals/Pain Pain Assessment: 0-10 Pain Score: 7  Pain Location: R knee  Pain Descriptors / Indicators: Sore;Aching Pain Intervention(s): Limited activity within patient's tolerance;Repositioned;Ice applied;Monitored during session;Premedicated before session    Home Living Family/patient expects to be discharged to:: Private residence Living Arrangements: Alone Available Help at Discharge: Family;Available 24 hours/day(sister will stay with him if needed, otherwise available PRN) Type of Home: House Home Access: Stairs to enter   Entrance  Stairs-Number of Steps: 2 Home Layout: One level Home Equipment: [place] - 2 wheels;Bedside commode;Cane - single point      Prior Function Level of Independence: Independent               Hand Dominance   Dominant Hand: Right    Extremity/Trunk Assessment   Upper Extremity Assessment Upper Extremity Assessment: Overall WFL for tasks assessed    Lower Extremity  Assessment Lower Extremity Assessment: Overall WFL for tasks assessed;RLE deficits/detail RLE Deficits / Details: suspected post-surgical weakness; able to perform ankle pumps, quad set, SLR without quad lag and without lift assist  RLE Sensation: WNL    Cervical / Trunk Assessment Cervical / Trunk Assessment: Normal  Communication   Communication: No difficulties  Cognition Arousal/Alertness: Awake/alert Behavior During Therapy: WFL for tasks assessed/performed Overall Cognitive Status: Within Functional Limits for tasks assessed                                        General Comments      Exercises Total Joint Exercises Goniometric ROM: knee aarom ~5-80*, limited by pain   Assessment/Plan    PT Assessment Patient needs continued PT services  PT Problem List Decreased strength;Pain;Decreased range of motion;Decreased activity tolerance;Decreased balance;Decreased knowledge of use of DME;Decreased mobility       PT Treatment Interventions DME instruction;Therapeutic activities;Gait training;Therapeutic exercise;Patient/family education;Balance training;Stair training;Functional mobility training    PT Goals (Current goals can be found in the Care Plan section)  Acute Rehab PT Goals Patient Stated Goal: decrease R knee pain  PT Goal Formulation: With patient Time For Goal Achievement: 05/09/18 Potential to Achieve Goals: Good    Frequency 7X/week   Barriers to discharge        Co-evaluation               AM-PAC PT "6 Clicks" Mobility  Outcome Measure Help needed turning from your back to your side while in a flat bed without using bedrails?: A Little Help needed moving from lying on your back to sitting on the side of a flat bed without using bedrails?: A Little Help needed moving to and from a bed to a chair (including a wheelchair)?: A Little Help needed standing up from a chair using your arms (e.g., wheelchair or bedside chair)?: A  Little Help needed to walk in hospital room?: A Little Help needed climbing 3-5 steps with a railing? : A Little 6 Click Score: 18    End of Session Equipment Utilized During Treatment: Gait belt Activity Tolerance: Treatment limited secondary to medical complications (Comment);Patient limited by pain Patient left: in bed;with bed alarm set;with family/visitor present;with call bell/phone within reach;with SCD's reapplied Nurse Communication: Mobility status PT Visit Diagnosis: Other abnormalities of gait and mobility (R26.89);Difficulty in walking, not elsewhere classified (R26.2)    Time: 1630-1700 PT Time Calculation (min) (ACUTE ONLY): 30 min   Charges:   PT Evaluation $PT Eval Low Complexity: 1 Low PT Treatments $Gait Training: 8-22 mins       Julien Girt, PT Acute Rehabilitation Services Pager [phone]  Office [phone]   Roxine Caddy D Elonda Husky 05/09/2018, 6:20 PM

## 2018-05-09 NOTE — H&P (Signed)
TOTAL KNEE ADMISSION H&P  Patient is being admitted for right total knee arthroplasty.  Subjective:  Chief Complaint:right knee pain.  HPI: Dean Williams, 68 y.o. male, has a history of pain and functional disability in the right knee due to arthritis and has failed non-surgical conservative treatments for greater than 12 weeks to includeNSAID's and/or analgesics, corticosteriod injections, viscosupplementation injections, weight reduction as appropriate and activity modification.  Onset of symptoms was gradual, starting 5 years ago with gradually worsening course since that time. The patient noted prior procedures on the knee to include  arthroscopy on the right knee(s).  Patient currently rates pain in the right knee(s) at 8 out of 10 with activity. Patient has night pain, worsening of pain with activity and weight bearing, pain that interferes with activities of daily living, pain with passive range of motion and joint swelling.  Patient has evidence of subchondral sclerosis, periarticular osteophytes and joint space narrowing by imaging studies. This patient has had Failure of all reasonable conservative care. There is no active infection.  There are no active problems to display for this patient.  Past Medical History:  Diagnosis Date  . Arthritis   . Hypertension   . Pre-diabetes     Past Surgical History:  Procedure Laterality Date  . CARPAL TUNNEL RELEASE     left hand  . KNEE SURGERY  2010   right knee  arthroscopic    Current Facility-Administered Medications  Medication Dose Route Frequency Provider Last Rate Last Dose  . bupivacaine liposome (EXPAREL) 1.3 % injection 266 mg  20 mL Infiltration Once Minda Ditto, [facility]       Current Outpatient Medications  Medication Sig Dispense Refill Last Dose  . amLODipine (NORVASC) 5 MG tablet Take 5 mg by mouth daily.   6 Taking  . Misc Natural Products (OSTEO BI-FLEX ADV JOINT SHIELD PO) Take 1-2 tablets by mouth daily.     .  Multiple Vitamins-Minerals (MULTIVITAMIN WITH MINERALS) tablet Take 1 tablet by mouth daily.     . traMADol (ULTRAM) 50 MG tablet Take 50 mg by mouth every 6 (six) hours as needed (pain).    Taking  . valACYclovir (VALTREX) 500 MG tablet Take 1,000 mg by mouth 2 (two) times daily as needed (outbreaks).   3 Taking  . vitamin E 400 UNIT capsule Take 400 Units by mouth daily.      No Known Allergies  Social History   Tobacco Use  . Smoking status: Never Smoker  . Smokeless tobacco: Never Used  . Tobacco comment: smoked 2 years 40 years ago  Substance Use Topics  . Alcohol use: Not Currently    Comment: occasional    No family history on file.   ROS ROS: I have reviewed the patient's review of systems thoroughly and there are no positive responses as relates to the HPI. Objective:  Physical Exam  Vital signs in last 24 hours:   Well-developed well-nourished patient in no acute distress. Alert and oriented x3 HEENT:within normal limits Cardiac: Regular rate and rhythm Pulmonary: Lungs clear to auscultation Abdomen: Soft and nontender.  Normal active bowel sounds  Musculoskeletal: (Right knee: Painful range of motion.  Limited range of motion.  No instability.  Trace effusion. Labs: Recent Results (from the past 2160 hour(s))  Urinalysis, Routine w reflex microscopic     Status: None   Collection Time: 05/06/18 10:01 AM  Result Value Ref Range   Color, Urine YELLOW YELLOW   APPearance CLEAR CLEAR  Specific Gravity, Urine 1.023 1.005 - 1.030   pH 5.0 5.0 - 8.0   Glucose, UA NEGATIVE NEGATIVE mg/dL   Hgb urine dipstick NEGATIVE NEGATIVE   Bilirubin Urine NEGATIVE NEGATIVE   Ketones, ur NEGATIVE NEGATIVE mg/dL   Protein, ur NEGATIVE NEGATIVE mg/dL   Nitrite NEGATIVE NEGATIVE   Leukocytes, UA NEGATIVE NEGATIVE    Comment: Performed at [facility] [address], [place], [place] [address]  Surgical pcr screen     Status: Abnormal   Collection Time:  05/06/18 10:02 AM  Result Value Ref Range   MRSA, PCR NEGATIVE NEGATIVE   Staphylococcus aureus POSITIVE (A) NEGATIVE    Comment: (NOTE) The Xpert SA Assay (FDA approved for NASAL specimens in patients 96 years of age and older), is one component of a comprehensive surveillance program. It is not intended to diagnose infection nor to guide or monitor treatment. Performed at [facility], [place] [address] [place], [place] [address]   APTT     Status: None   Collection Time: 05/06/18 10:30 AM  Result Value Ref Range   aPTT 27 24 - 36 seconds    Comment: Performed at [facility], [place] [address] [place], [place] [address]  CBC WITH DIFFERENTIAL     Status: Abnormal   Collection Time: 05/06/18 10:30 AM  Result Value Ref Range   WBC 4.4 4.0 - 10.5 K/uL   RBC 5.21 4.22 - 5.81 MIL/uL   Hemoglobin 15.1 13.0 - 17.0 g/dL   HCT 45.3 39.0 - 52.0 %   MCV 86.9 80.0 - 100.0 fL   MCH 29.0 26.0 - 34.0 pg   MCHC 33.3 30.0 - 36.0 g/dL   RDW 13.2 11.5 - 15.5 %   Platelets 273 150 - 400 K/uL   nRBC 0.0 0.0 - 0.2 %   Neutrophils Relative % 32 %   Neutro Abs 1.4 (L) 1.7 - 7.7 K/uL   Lymphocytes Relative 58 %   Lymphs Abs 2.6 0.7 - 4.0 K/uL   Monocytes Relative 7 %   Monocytes Absolute 0.3 0.1 - 1.0 K/uL   Eosinophils Relative 3 %   Eosinophils Absolute 0.1 0.0 - 0.5 K/uL   Basophils Relative 0 %   Basophils Absolute 0.0 0.0 - 0.1 K/uL   Immature Granulocytes 0 %   Abs Immature Granulocytes 0.01 0.00 - 0.07 K/uL    Comment: Performed at [facility], [place] [address], [place], [place] [address]  Comprehensive metabolic panel     Status: Abnormal   Collection Time: 05/06/18 10:30 AM  Result Value Ref Range   Sodium 139 135 - 145 mmol/L   Potassium 4.1 3.5 - 5.1 mmol/L   Chloride 108 98 - 111 mmol/L   CO2 23 22 - 32 mmol/L   Glucose, Bld 139 (H) 70 - 99 mg/dL   BUN 19 8 - 23 mg/dL   Creatinine, Ser 1.06 0.61 - 1.24 mg/dL   Calcium 9.5  8.9 - 10.3 mg/dL   Total Protein 8.3 (H) 6.5 - 8.1 g/dL   Albumin 4.7 3.5 - 5.0 g/dL   AST 27 15 - 41 U/L   ALT 28 0 - 44 U/L   Alkaline Phosphatase 53 38 - 126 U/L   Total Bilirubin 0.6 0.3 - 1.2 mg/dL   GFR calc non Af Amer >60 >60 mL/min   GFR calc Af Amer >60 >60 mL/min   Anion gap 8 5 - 15    Comment: Performed  at [facility], [place] [address], [place], [place] [address]  Protime-INR     Status: None   Collection Time: 05/06/18 10:30 AM  Result Value Ref Range   Prothrombin Time 14.0 11.4 - 15.2 seconds   INR 1.09     Comment: Performed at [facility], [place] [address], [place], [place] [address]  Type and screen Order type and screen if day of surgery is less than 15 days from draw of preadmission visit or order morning of surgery if day of surgery is greater than 6 days from preadmission visit.     Status: None   Collection Time: 05/06/18 10:30 AM  Result Value Ref Range   ABO/RH(D) O POS    Antibody Screen NEG    Sample Expiration 05/12/2018    Extend sample reason      NO TRANSFUSIONS OR PREGNANCY IN THE PAST 3 MONTHS Performed at [facility], [place] [address] [place], [place] [address]   ABO/Rh     Status: None   Collection Time: 05/06/18 10:37 AM  Result Value Ref Range   ABO/RH(D)      O POS Performed at [facility], [place] [address] [place], [place] [address]   Hemoglobin A1c     Status: Abnormal   Collection Time: 05/06/18 10:46 AM  Result Value Ref Range   Hgb A1c MFr Bld 6.6 (H) 4.8 - 5.6 %    Comment: (NOTE) Pre diabetes:          5.7%-6.4% Diabetes:              >6.4% Glycemic control for   <7.0% adults with diabetes    Mean Plasma Glucose 142.72 mg/dL    Comment: Performed at [facility] [address] [place], [place] [address]    Estimated body mass index is 31.45 kg/m as calculated from the following:   Height as of 05/06/18: 5\' 9"  (1.753 m).   Weight as of 05/06/18:  96.6 kg.   Imaging Review Plain radiographs demonstrate severe degenerative joint disease of the right knee(s). The overall alignment ismild varus. The bone quality appears to be fair for age and reported activity level.      Assessment/Plan:  End stage arthritis, right knee   The patient history, physical examination, clinical judgment of the provider and imaging studies are consistent with end stage degenerative joint disease of the right knee(s) and total knee arthroplasty is deemed medically necessary. The treatment options including medical management, injection therapy arthroscopy and arthroplasty were discussed at length. The risks and benefits of total knee arthroplasty were presented and reviewed. The risks due to aseptic loosening, infection, stiffness, patella tracking problems, thromboembolic complications and other imponderables were discussed. The patient acknowledged the explanation, agreed to proceed with the plan and consent was signed. Patient is being admitted for inpatient treatment for surgery, pain control, PT, OT, prophylactic antibiotics, VTE prophylaxis, progressive ambulation and ADL's and discharge planning. The patient is planning to be discharged home with home health services     Patient's anticipated LOS is less than 2 midnights, meeting these requirements: - Younger than 37 - Lives within 1 hour of care - Has a competent adult at home to recover with post-op recover - NO history of  - Chronic pain requiring opiods  - Diabetes  - Coronary Artery Disease  - Heart failure  - Heart attack  - Stroke  - DVT/VTE  - Cardiac arrhythmia  -  Respiratory Failure/COPD  - Renal failure  - Anemia  - Advanced Liver disease

## 2018-05-09 NOTE — Transfer of Care (Signed)
Immediate Anesthesia Transfer of Care Note  Patient: Dean Williams  Procedure(s) Performed: TOTAL KNEE ARTHROPLASTY (Right Knee)  Patient Location: PACU  Anesthesia Type:Regional and Spinal  Level of Consciousness: sedated  Airway & Oxygen Therapy: Patient Spontanous Breathing and Patient connected to face mask oxygen  Post-op Assessment: Report given to RN and Post -op Vital signs reviewed and stable  Post vital signs: Reviewed and stable  Last Vitals:  Vitals Value Taken Time  BP 111/72 05/09/2018 12:24 PM  Temp    Pulse 69 05/09/2018 12:25 PM  Resp 16 05/09/2018 12:25 PM  SpO2 100 % 05/09/2018 12:25 PM  Vitals shown include unvalidated device data.  Last Pain:  Vitals:   05/09/18 0942  TempSrc:   PainSc: 0-No pain      Patients Stated Pain Goal: 3 (28/41/32 4401)  Complications: No apparent anesthesia complications

## 2018-05-09 NOTE — Progress Notes (Signed)
Assisted Dr. Brock with right, ultrasound guided, adductor canal block. Side rails up, monitors on throughout procedure. See vital signs in flow sheet. Tolerated Procedure well.  

## 2018-05-09 NOTE — Anesthesia Procedure Notes (Signed)
Spinal  Patient location during procedure: OR Start time: 05/09/2018 10:13 AM End time: 05/09/2018 10:20 AM Staffing Anesthesiologist: Audry Pili, MD Performed: anesthesiologist  Preanesthetic Checklist Completed: patient identified, surgical consent, pre-op evaluation, timeout performed, IV checked, risks and benefits discussed and monitors and equipment checked Spinal Block Patient position: sitting Prep: DuraPrep Patient monitoring: heart rate, cardiac monitor, continuous pulse ox and blood pressure Approach: midline Location: L3-4 Injection technique: single-shot Needle Needle type: Pencan  Needle gauge: 24 G Additional Notes Consent was obtained prior to the procedure with all questions answered and concerns addressed. Risks including, but not limited to, bleeding, infection, nerve damage, paralysis, failed block, inadequate analgesia, allergic reaction, high spinal, itching, and headache were discussed and the patient wished to proceed. Functioning IV was confirmed and monitors were applied. Sterile prep and drape, including hand hygiene, mask, and sterile gloves were used. The patient was positioned and the spine was prepped. The skin was anesthetized with lidocaine. After an unsuccessful attempt by CRNA, MD able to locate intrathecal space. Free flow of clear CSF was obtained prior to injecting local anesthetic into the CSF. The spinal needle aspirated freely following injection. The needle was carefully withdrawn. The patient tolerated the procedure well.   Renold Don, MD

## 2018-05-09 NOTE — Discharge Instructions (Signed)

## 2018-05-09 NOTE — Anesthesia Preprocedure Evaluation (Addendum)
Anesthesia Evaluation  Patient identified by MRN, date of birth, ID band Patient awake    Reviewed: Allergy & Precautions, NPO status , Patient's Chart, lab work & pertinent test results  History of Anesthesia Complications Negative for: history of anesthetic complications  Airway Mallampati: II  TM Distance: >3 FB Neck ROM: Full    Dental  (+) Dental Advisory Given, Teeth Intact   Pulmonary neg pulmonary ROS,    breath sounds clear to auscultation       Cardiovascular hypertension, Pt. on medications  Rhythm:Regular Rate:Normal     Neuro/Psych negative neurological ROS  negative psych ROS   GI/Hepatic negative GI ROS, Neg liver ROS,   Endo/Other   Obesity Pre-DM   Renal/GU negative Renal ROS     Musculoskeletal  (+) Arthritis ,   Abdominal   Peds  Hematology negative hematology ROS (+)   Anesthesia Other Findings   Reproductive/Obstetrics                            Anesthesia Physical Anesthesia Plan  ASA: II  Anesthesia Plan: Spinal   Post-op Pain Management:  Regional for Post-op pain   Induction:   PONV Risk Score and Plan: 1 and Treatment may vary due to age or medical condition and Propofol infusion  Airway Management Planned: Natural Airway and Simple Face Mask  Additional Equipment: None  Intra-op Plan:   Post-operative Plan:   Informed Consent: I have reviewed the patients History and Physical, chart, labs and discussed the procedure including the risks, benefits and alternatives for the proposed anesthesia with the patient or authorized representative who has indicated his/her understanding and acceptance.       Plan Discussed with: CRNA and Anesthesiologist  Anesthesia Plan Comments: (Labs reviewed, platelets acceptable. Discussed risks and benefits of spinal, including spinal/epidural hematoma, infection, failed block, and PDPH. Patient expressed  understanding and wished to proceed. )       Anesthesia Quick Evaluation

## 2018-05-10 LAB — CBC
HCT: 38.5 % — ABNORMAL LOW (ref 39.0–52.0)
Hemoglobin: 12.5 g/dL — ABNORMAL LOW (ref 13.0–17.0)
MCH: 28.8 pg (ref 26.0–34.0)
MCHC: 32.5 g/dL (ref 30.0–36.0)
MCV: 88.7 fL (ref 80.0–100.0)
PLATELETS: 212 10*3/uL (ref 150–400)
RBC: 4.34 MIL/uL (ref 4.22–5.81)
RDW: 13.2 % (ref 11.5–15.5)
WBC: 7.4 10*3/uL (ref 4.0–10.5)
nRBC: 0 % (ref 0.0–0.2)

## 2018-05-10 NOTE — Care Management (Signed)
Spoke to [employer] team, message for potential code [phone], pt was approved IP. Jonnie Finner RN CCM Case Mgmt phone [phone]

## 2018-05-10 NOTE — Progress Notes (Signed)
Physical Therapy Treatment Patient Details Name: Dean Williams MRN: [id number] DOB: 09-14-1950 Today's Date: 05/10/2018    History of Present Illness 68 yo male s/p R TKR on 05/09/18. PMH includes OA, HTN, pre-DM, R knee arthroscopy, L carpal tunnel release.     PT Comments    POD # 1 am session Assisted OOB. Demonstrated and instructed pt how to use belt to self assist LE as a leg lifter Assisted with amb a greater distance in hallway. General Gait Details: <25% VC's on proper walker to self distance and upright posture. Then returned to room to perform some TE's following HEP handout.  Instructed on proper tech, freq as well as use of ICE.     Follow Up Recommendations  Follow surgeon's recommendation for DC plan and follow-up therapies;Supervision for mobility/OOB     Equipment Recommendations  None recommended by PT    Recommendations for Other Services       Precautions / Restrictions Precautions Precautions: Fall Restrictions Weight Bearing Restrictions: No Other Position/Activity Restrictions: WBAT     Mobility  Bed Mobility Overal bed mobility: Needs Assistance Bed Mobility: Supine to Sit     Supine to sit: Min guard;Supervision     General bed mobility comments: demonstarted and instructed how to use a belt to self assist LE off bed   Transfers Overall transfer level: Needs assistance Equipment used: Rolling walker (2 wheeled) Transfers: Sit to/from Stand Sit to Stand: Supervision;Min guard         General transfer comment: 25% VC's on safety with turns and proper LE placement prior to sit  Ambulation/Gait Ambulation/Gait assistance: Supervision;Min guard Gait Distance (Feet): 75 Feet Assistive device: Rolling walker (2 wheeled) Gait Pattern/deviations: Step-to pattern;Decreased stance time - right;Decreased weight shift to right;Antalgic Gait velocity: decreased    General Gait Details: <25% VC's on proper walker to self distance and upright  posture    Stairs             Wheelchair Mobility    Modified Rankin (Stroke Patients Only)       Balance                                            Cognition Arousal/Alertness: Awake/alert Behavior During Therapy: WFL for tasks assessed/performed Overall Cognitive Status: Within Functional Limits for tasks assessed                                        Exercises   Total Knee Replacement TE's 10 reps B LE ankle pumps 10 reps towel squeezes 10 reps knee presses 10 reps heel slides  10 reps SAQ's 10 reps SLR's 10 reps ABD Followed by ICE     General Comments        Pertinent Vitals/Pain      Home Living                      Prior Function            PT Goals (current goals can now be found in the care plan section) Progress towards PT goals: Progressing toward goals    Frequency    7X/week      PT Plan Current plan remains appropriate    Co-evaluation  AM-PAC PT "6 Clicks" Mobility   Outcome Measure  Help needed turning from your back to your side while in a flat bed without using bedrails?: A Little Help needed moving from lying on your back to sitting on the side of a flat bed without using bedrails?: A Little Help needed moving to and from a bed to a chair (including a wheelchair)?: A Little Help needed standing up from a chair using your arms (e.g., wheelchair or bedside chair)?: A Little Help needed to walk in hospital room?: A Little Help needed climbing 3-5 steps with a railing? : A Little 6 Click Score: 18    End of Session Equipment Utilized During Treatment: Gait belt Activity Tolerance: Treatment limited secondary to medical complications (Comment);Patient limited by pain Patient left: in chair;with call bell/phone within reach Nurse Communication: Mobility status PT Visit Diagnosis: Other abnormalities of gait and mobility (R26.89);Difficulty in walking, not  elsewhere classified (R26.2)     Time: 3151-7616 PT Time Calculation (min) (ACUTE ONLY): 32 min  Charges:  $Gait Training: 8-22 mins $Therapeutic Exercise: 8-22 mins                     Rica Koyanagi  PTA Acute  Rehabilitation Services Pager      [phone] Office      [phone]

## 2018-05-10 NOTE — Progress Notes (Signed)
Subjective: 1 Day Post-Op Procedure(s) (LRB): TOTAL KNEE ARTHROPLASTY (Right) Patient reports pain as mild.    Patient was in a CPM machine for 3 hours yesterday developed some soreness in his right thigh.  He has been placing heat packs there.  Otherwise doing well.  Denies any shortness of breath or chest pain.  Objective: Vital signs in last 24 hours: Temp:  [97.5 F (36.4 C)-98.9 F (37.2 C)] 98.6 F (37 C) (02/08 0501) Pulse Rate:  [58-89] 83 (02/08 0501) Resp:  [13-21] 18 (02/08 0054) BP: (92-151)/(58-118) 147/83 (02/08 0501) SpO2:  [88 %-100 %] 96 % (02/08 0501) Weight:  [96.6 kg] 96.6 kg (02/07 0901)  Intake/Output from previous day: 02/07 0701 - 02/08 0700 In: 2742.6 [P.O.:1240; I.V.:1302.6; IV Piggyback:200] Out: 2005 [Urine:1975; Blood:30] Intake/Output this shift: No intake/output data recorded.  Recent Labs    05/10/18 0412  HGB 12.5*   Recent Labs    05/10/18 0412  WBC 7.4  RBC 4.34  HCT 38.5*  PLT 212   Patient is awake and alert Resting comfortably Dressing intact over right knee.  Right thigh with some tenderness to palpation.  No swelling.  Soft compartments.  He is able to actively extend the knee.  Able to actively dorsiflex plantarflex the ankle.  Sensation intact light touch dorsal plantar surface of the foot.  Foot is warm and well-perfused.  No tenderness to palpation about the calf.   Assessment/Plan: 1 Day Post-Op Procedure(s) (LRB): TOTAL KNEE ARTHROPLASTY (Right)  Patient is doing well and has some thigh soreness after prolonged CPM usage yesterday.  No notable swelling.  Firing quad muscles without difficulty. He will work with physical therapy today. Likely discharge home later today if cleared by physical therapy.   He will continue aspirin for DVT prophylaxis on discharge. He will call the office with concerns.    Erle Crocker 05/10/2018, 7:51 AM

## 2018-05-10 NOTE — Progress Notes (Signed)
Physical Therapy Treatment Patient Details Name: Dean Williams MRN: [id number] DOB: [birth year] Today's Date: 05/10/2018    History of Present Illness 68 yo male s/p R TKR on 05/09/18. PMH includes OA, HTN, pre-DM, R knee arthroscopy, L carpal tunnel release.     PT Comments    POD # 1 pm session Brother present durnig session.  Had brother "hands on" assist with all mobility with VC's for safety.  Assisted with amb in hallway and practiced stairs twice.  Addressed all mobility questions, discussed appropriate activity, educated on use of ICE.  Pt ready for D/C to home.   Follow Up Recommendations  Follow surgeon's recommendation for DC plan and follow-up therapies;Supervision for mobility/OOB     Equipment Recommendations  None recommended by PT    Recommendations for Other Services       Precautions / Restrictions Precautions Precautions: Fall Restrictions Weight Bearing Restrictions: No Other Position/Activity Restrictions: WBAT     Mobility  Bed Mobility Overal bed mobility: Needs Assistance Bed Mobility: Supine to Sit     Supine to sit: Min guard;Supervision     General bed mobility comments: Pt OOB in recliner   Transfers Overall transfer level: Needs assistance Equipment used: Rolling walker (2 wheeled) Transfers: Sit to/from Stand Sit to Stand: Supervision;Min guard         General transfer comment: 25% VC's on safety with turns and proper LE placement prior to sit  Ambulation/Gait Ambulation/Gait assistance: Supervision;Min guard Gait Distance (Feet): 55 Feet Assistive device: Rolling walker (2 wheeled) Gait Pattern/deviations: Step-to pattern;Decreased stance time - right;Decreased weight shift to right;Antalgic Gait velocity: decreased    General Gait Details: <25% VC's on proper walker to self distance and upright posture   Had brother "hands on" assist    Stairs Stairs: Yes Stairs assistance: Min assist;Min guard Stair Management: Two  rails;Step to pattern;Forwards Number of Stairs: 3 General stair comments: with brother present "hands on" assisted   Wheelchair Mobility    Modified Rankin (Stroke Patients Only)       Balance                                            Cognition Arousal/Alertness: Awake/alert Behavior During Therapy: WFL for tasks assessed/performed Overall Cognitive Status: Within Functional Limits for tasks assessed                                        Exercises      General Comments        Pertinent Vitals/Pain      Home Living                      Prior Function            PT Goals (current goals can now be found in the care plan section) Progress towards PT goals: Progressing toward goals    Frequency    7X/week      PT Plan Current plan remains appropriate    Co-evaluation              AM-PAC PT "6 Clicks" Mobility   Outcome Measure  Help needed turning from your back to your side while in a flat bed without using bedrails?: A Little Help needed moving from  lying on your back to sitting on the side of a flat bed without using bedrails?: A Little Help needed moving to and from a bed to a chair (including a wheelchair)?: A Little Help needed standing up from a chair using your arms (e.g., wheelchair or bedside chair)?: A Little Help needed to walk in hospital room?: A Little Help needed climbing 3-5 steps with a railing? : A Little 6 Click Score: 18    End of Session Equipment Utilized During Treatment: Gait belt Activity Tolerance: Treatment limited secondary to medical complications (Comment);Patient limited by pain Patient left: in chair;with call bell/phone within reach Nurse Communication: Mobility status PT Visit Diagnosis: Other abnormalities of gait and mobility (R26.89);Difficulty in walking, not elsewhere classified (R26.2)     Time: 9166-0600 PT Time Calculation (min) (ACUTE ONLY): 26  min  Charges:  $Gait Training: 8-22 mins $Self Care/Home Management: [place]  PTA Acute  Rehabilitation Services Pager      [phone] Office      [phone]

## 2018-05-10 NOTE — Plan of Care (Signed)
Plan of care reviewed and discussed with the patient. 

## 2018-05-10 NOTE — Care Management Note (Signed)
Case Management Note  Patient Details  Name: Dean Williams MRN: [id number] Date of Birth: [birth year]  Subjective/Objective:    Right TKA                Action/Plan: NCM spoke to pt and offered choice for HH/Medicare list provided and placed on chart. Pt has RW, 3n1 and CPM at home. Delivered by [facility]. Pt agreeable to [facility] for [place].     Expected Discharge Date:  05/10/18               Expected Discharge Plan:  [place]  In-House Referral:  NA  Discharge planning Services  CM Consult  Post Acute Care Choice:  Home Health Choice offered to:  Patient  DME Arranged:  CPM, Walker rolling DME Agency:  [facility]  HH Arranged:  PT [place] Agency:  [facility] [employer]  Status of Service:  Completed, signed off  If discussed at [employer], dates discussed:    Additional Comments:  Erenest Rasher, RN 05/10/2018, 2:34 PM

## 2018-05-11 DIAGNOSIS — Z471 Aftercare following joint replacement surgery: Secondary | ICD-10-CM | POA: Diagnosis not present

## 2018-05-11 DIAGNOSIS — Z96651 Presence of right artificial knee joint: Secondary | ICD-10-CM | POA: Diagnosis not present

## 2018-05-11 DIAGNOSIS — I10 Essential (primary) hypertension: Secondary | ICD-10-CM | POA: Diagnosis not present

## 2018-05-13 ENCOUNTER — Encounter (HOSPITAL_COMMUNITY): Payer: Self-pay | Admitting: Orthopedic Surgery

## 2018-05-14 DIAGNOSIS — I10 Essential (primary) hypertension: Secondary | ICD-10-CM | POA: Diagnosis not present

## 2018-05-14 DIAGNOSIS — Z96651 Presence of right artificial knee joint: Secondary | ICD-10-CM | POA: Diagnosis not present

## 2018-05-14 DIAGNOSIS — Z471 Aftercare following joint replacement surgery: Secondary | ICD-10-CM | POA: Diagnosis not present

## 2018-05-16 DIAGNOSIS — Z96651 Presence of right artificial knee joint: Secondary | ICD-10-CM | POA: Diagnosis not present

## 2018-05-16 DIAGNOSIS — Z471 Aftercare following joint replacement surgery: Secondary | ICD-10-CM | POA: Diagnosis not present

## 2018-05-16 DIAGNOSIS — I10 Essential (primary) hypertension: Secondary | ICD-10-CM | POA: Diagnosis not present

## 2018-05-16 NOTE — Discharge Summary (Signed)
Patient ID: Dean Williams MRN: [id number] DOB/AGE: [birth year] 68 y.o.  Admit date: 05/09/2018 Discharge date: 05/10/2018 Admission Diagnoses:  Principal Problem:   Primary osteoarthritis of right knee   Discharge Diagnoses:  Same  Past Medical History:  Diagnosis Date  . Arthritis   . Hypertension   . Pre-diabetes     Surgeries: Procedure(s):Right TOTAL KNEE ARTHROPLASTY on 05/09/2018   Consultants:   Discharged Condition: Improved  Hospital Course: Dean Williams is an 68 y.o. male who was admitted 05/09/2018 for operative treatment ofPrimary osteoarthritis of right knee. Patient has severe unremitting pain that affects sleep, daily activities, and work/hobbies. After pre-op clearance the patient was taken to the operating room on 05/09/2018 and underwent  Procedure(s):Right TOTAL KNEE ARTHROPLASTY.    Patient was given perioperative antibiotics:  Anti-infectives (From admission, onward)   Start     Dose/Rate Route Frequency Ordered Stop   05/09/18 1700  ceFAZolin (ANCEF) IVPB 2g/100 mL premix     2 g 200 mL/hr over 30 Minutes Intravenous Every 6 hours 05/09/18 1609 05/09/18 2344   05/09/18 0845  ceFAZolin (ANCEF) IVPB 2g/100 mL premix     2 g 200 mL/hr over 30 Minutes Intravenous On call to O.R. 05/09/18 7628 05/09/18 1021       Patient was given sequential compression devices, early ambulation, and chemoprophylaxis to prevent DVT.  Patient benefited maximally from hospital stay and there were no complications.    Recent vital signs: No data found.   Recent laboratory studies: No results for input(s): WBC, HGB, HCT, PLT, NA, K, CL, CO2, BUN, CREATININE, GLUCOSE, INR, CALCIUM in the last 72 hours.  Invalid input(s): PT, 2   Discharge Medications:   Allergies as of 05/10/2018   No Known Allergies     Medication List    TAKE these medications   amLODipine 5 MG tablet Commonly known as:  NORVASC Take 5 mg by mouth daily.   aspirin EC 325 MG  tablet Take 1 tablet (325 mg total) by mouth 2 (two) times daily after a meal. Take x 1 month post op to decrease risk of blood clots.   cetirizine 10 MG tablet Commonly known as:  ZYRTEC Take 10 mg by mouth daily.   docusate sodium 100 MG capsule Commonly known as:  COLACE Take 1 capsule (100 mg total) by mouth 2 (two) times daily.   multivitamin with minerals tablet Take 1 tablet by mouth daily.   OSTEO BI-FLEX ADV JOINT SHIELD PO Take 1-2 tablets by mouth daily.   oxyCODONE-acetaminophen 5-325 MG tablet Commonly known as:  PERCOCET/ROXICET Take 1-2 tablets by mouth every 6 (six) hours as needed for severe pain.   tiZANidine 2 MG tablet Commonly known as:  ZANAFLEX Take 1 tablet (2 mg total) by mouth every 8 (eight) hours as needed for muscle spasms.   traMADol 50 MG tablet Commonly known as:  ULTRAM Take 50 mg by mouth every 6 (six) hours as needed (pain).   valACYclovir 500 MG tablet Commonly known as:  VALTREX Take 1,000 mg by mouth 2 (two) times daily as needed (outbreaks).   vitamin E 400 UNIT capsule Take 400 Units by mouth daily.       Diagnostic Studies: Dg Chest 2 View  Result Date: 05/06/2018 CLINICAL DATA:  Preop for right knee replacement. EXAM: CHEST - 2 VIEW COMPARISON:  Radiographs of May 15, 2010 FINDINGS: The heart size and mediastinal contours are within normal limits. Both lungs are clear. The visualized skeletal structures are  unremarkable. IMPRESSION: No active cardiopulmonary disease. Electronically Signed   By: Marijo Conception, M.D.   On: 05/06/2018 14:48    Disposition:   Discharge Instructions    Call MD / Call 911   Complete by:  As directed    If you experience chest pain or shortness of breath, CALL 911 and be transported to the hospital emergency room.  If you develope a fever above 101 F, pus (white drainage) or increased drainage or redness at the wound, or calf pain, call your surgeon's office.   Constipation Prevention    Complete by:  As directed    Drink plenty of fluids.  Prune juice may be helpful.  You may use a stool softener, such as Colace (over the counter) 100 mg twice a day.  Use MiraLax (over the counter) for constipation as needed.   Diet - low sodium heart healthy   Complete by:  As directed    Increase activity slowly as tolerated   Complete by:  As directed       Follow-up Information    Dorna Leitz, MD. Go on 05/26/2018.   Specialty:  Orthopedic Surgery Why:  Your appointment has been made for 10:00   Contact information: [place] [address] [phone]        [facility] Follow up.   Specialty:  [facility] Why:  You will been seen by HHPT for 5 visits prior to MD follow up and starting OPPT Contact information: [place] [address] [phone]        [facility] Follow up.   Why:  You are scheduled to start Outpatient physical therapy at 11:20. Please go straight over after your MD visit to complete your paperwork  Contact information: Physical Therapy [place] [address] [phone]            Signed: Erlene Senters 05/16/2018, 9:19 AM

## 2018-05-19 DIAGNOSIS — Z96651 Presence of right artificial knee joint: Secondary | ICD-10-CM | POA: Diagnosis not present

## 2018-05-19 DIAGNOSIS — Z471 Aftercare following joint replacement surgery: Secondary | ICD-10-CM | POA: Diagnosis not present

## 2018-05-19 DIAGNOSIS — I10 Essential (primary) hypertension: Secondary | ICD-10-CM | POA: Diagnosis not present

## 2018-05-21 DIAGNOSIS — Z96651 Presence of right artificial knee joint: Secondary | ICD-10-CM | POA: Diagnosis not present

## 2018-05-21 DIAGNOSIS — I10 Essential (primary) hypertension: Secondary | ICD-10-CM | POA: Diagnosis not present

## 2018-05-21 DIAGNOSIS — Z471 Aftercare following joint replacement surgery: Secondary | ICD-10-CM | POA: Diagnosis not present

## 2018-05-22 DIAGNOSIS — M1711 Unilateral primary osteoarthritis, right knee: Secondary | ICD-10-CM | POA: Diagnosis not present

## 2018-05-22 DIAGNOSIS — Z9889 Other specified postprocedural states: Secondary | ICD-10-CM | POA: Diagnosis not present

## 2018-05-26 DIAGNOSIS — Z96651 Presence of right artificial knee joint: Secondary | ICD-10-CM | POA: Diagnosis not present

## 2018-05-26 DIAGNOSIS — M25561 Pain in right knee: Secondary | ICD-10-CM | POA: Diagnosis not present

## 2018-05-26 DIAGNOSIS — M25661 Stiffness of right knee, not elsewhere classified: Secondary | ICD-10-CM | POA: Diagnosis not present

## 2018-05-28 DIAGNOSIS — M25661 Stiffness of right knee, not elsewhere classified: Secondary | ICD-10-CM | POA: Diagnosis not present

## 2018-05-28 DIAGNOSIS — Z96651 Presence of right artificial knee joint: Secondary | ICD-10-CM | POA: Diagnosis not present

## 2018-05-28 DIAGNOSIS — M25561 Pain in right knee: Secondary | ICD-10-CM | POA: Diagnosis not present

## 2018-06-02 DIAGNOSIS — M25661 Stiffness of right knee, not elsewhere classified: Secondary | ICD-10-CM | POA: Diagnosis not present

## 2018-06-02 DIAGNOSIS — M25561 Pain in right knee: Secondary | ICD-10-CM | POA: Diagnosis not present

## 2018-06-02 DIAGNOSIS — Z96651 Presence of right artificial knee joint: Secondary | ICD-10-CM | POA: Diagnosis not present

## 2018-06-04 DIAGNOSIS — M25561 Pain in right knee: Secondary | ICD-10-CM | POA: Diagnosis not present

## 2018-06-04 DIAGNOSIS — Z96651 Presence of right artificial knee joint: Secondary | ICD-10-CM | POA: Diagnosis not present

## 2018-06-04 DIAGNOSIS — M25661 Stiffness of right knee, not elsewhere classified: Secondary | ICD-10-CM | POA: Diagnosis not present

## 2018-06-05 DIAGNOSIS — M25561 Pain in right knee: Secondary | ICD-10-CM | POA: Diagnosis not present

## 2018-06-05 DIAGNOSIS — Z9889 Other specified postprocedural states: Secondary | ICD-10-CM | POA: Diagnosis not present

## 2018-06-09 DIAGNOSIS — Z96651 Presence of right artificial knee joint: Secondary | ICD-10-CM | POA: Diagnosis not present

## 2018-06-09 DIAGNOSIS — M25561 Pain in right knee: Secondary | ICD-10-CM | POA: Diagnosis not present

## 2018-06-09 DIAGNOSIS — M25661 Stiffness of right knee, not elsewhere classified: Secondary | ICD-10-CM | POA: Diagnosis not present

## 2018-06-11 DIAGNOSIS — Z96651 Presence of right artificial knee joint: Secondary | ICD-10-CM | POA: Diagnosis not present

## 2018-06-11 DIAGNOSIS — M25661 Stiffness of right knee, not elsewhere classified: Secondary | ICD-10-CM | POA: Diagnosis not present

## 2018-06-11 DIAGNOSIS — M25561 Pain in right knee: Secondary | ICD-10-CM | POA: Diagnosis not present

## 2018-06-16 DIAGNOSIS — M25561 Pain in right knee: Secondary | ICD-10-CM | POA: Diagnosis not present

## 2018-06-16 DIAGNOSIS — M25661 Stiffness of right knee, not elsewhere classified: Secondary | ICD-10-CM | POA: Diagnosis not present

## 2018-06-16 DIAGNOSIS — Z96651 Presence of right artificial knee joint: Secondary | ICD-10-CM | POA: Diagnosis not present

## 2018-06-18 DIAGNOSIS — Z96651 Presence of right artificial knee joint: Secondary | ICD-10-CM | POA: Diagnosis not present

## 2018-06-18 DIAGNOSIS — M25561 Pain in right knee: Secondary | ICD-10-CM | POA: Diagnosis not present

## 2018-06-18 DIAGNOSIS — M25661 Stiffness of right knee, not elsewhere classified: Secondary | ICD-10-CM | POA: Diagnosis not present

## 2018-06-23 DIAGNOSIS — M25661 Stiffness of right knee, not elsewhere classified: Secondary | ICD-10-CM | POA: Diagnosis not present

## 2018-06-23 DIAGNOSIS — M25561 Pain in right knee: Secondary | ICD-10-CM | POA: Diagnosis not present

## 2018-06-23 DIAGNOSIS — Z96651 Presence of right artificial knee joint: Secondary | ICD-10-CM | POA: Diagnosis not present

## 2018-06-25 DIAGNOSIS — M25661 Stiffness of right knee, not elsewhere classified: Secondary | ICD-10-CM | POA: Diagnosis not present

## 2018-06-25 DIAGNOSIS — M25561 Pain in right knee: Secondary | ICD-10-CM | POA: Diagnosis not present

## 2018-06-25 DIAGNOSIS — Z96651 Presence of right artificial knee joint: Secondary | ICD-10-CM | POA: Diagnosis not present

## 2018-06-30 DIAGNOSIS — M25661 Stiffness of right knee, not elsewhere classified: Secondary | ICD-10-CM | POA: Diagnosis not present

## 2018-06-30 DIAGNOSIS — M25561 Pain in right knee: Secondary | ICD-10-CM | POA: Diagnosis not present

## 2018-06-30 DIAGNOSIS — Z96651 Presence of right artificial knee joint: Secondary | ICD-10-CM | POA: Diagnosis not present

## 2018-07-02 DIAGNOSIS — M25661 Stiffness of right knee, not elsewhere classified: Secondary | ICD-10-CM | POA: Diagnosis not present

## 2018-07-02 DIAGNOSIS — Z96651 Presence of right artificial knee joint: Secondary | ICD-10-CM | POA: Diagnosis not present

## 2018-07-02 DIAGNOSIS — M25561 Pain in right knee: Secondary | ICD-10-CM | POA: Diagnosis not present

## 2018-07-14 DIAGNOSIS — M25661 Stiffness of right knee, not elsewhere classified: Secondary | ICD-10-CM | POA: Diagnosis not present

## 2018-07-14 DIAGNOSIS — M25561 Pain in right knee: Secondary | ICD-10-CM | POA: Diagnosis not present

## 2018-07-14 DIAGNOSIS — Z96651 Presence of right artificial knee joint: Secondary | ICD-10-CM | POA: Diagnosis not present

## 2018-07-15 DIAGNOSIS — Z471 Aftercare following joint replacement surgery: Secondary | ICD-10-CM | POA: Diagnosis not present

## 2018-07-15 DIAGNOSIS — M1712 Unilateral primary osteoarthritis, left knee: Secondary | ICD-10-CM | POA: Diagnosis not present

## 2018-07-17 DIAGNOSIS — M25561 Pain in right knee: Secondary | ICD-10-CM | POA: Diagnosis not present

## 2018-07-17 DIAGNOSIS — M25661 Stiffness of right knee, not elsewhere classified: Secondary | ICD-10-CM | POA: Diagnosis not present

## 2018-07-17 DIAGNOSIS — Z96651 Presence of right artificial knee joint: Secondary | ICD-10-CM | POA: Diagnosis not present

## 2018-07-22 DIAGNOSIS — M25561 Pain in right knee: Secondary | ICD-10-CM | POA: Diagnosis not present

## 2018-07-22 DIAGNOSIS — M25661 Stiffness of right knee, not elsewhere classified: Secondary | ICD-10-CM | POA: Diagnosis not present

## 2018-07-22 DIAGNOSIS — Z96651 Presence of right artificial knee joint: Secondary | ICD-10-CM | POA: Diagnosis not present

## 2018-07-24 DIAGNOSIS — M25561 Pain in right knee: Secondary | ICD-10-CM | POA: Diagnosis not present

## 2018-07-24 DIAGNOSIS — Z96651 Presence of right artificial knee joint: Secondary | ICD-10-CM | POA: Diagnosis not present

## 2018-07-24 DIAGNOSIS — M25661 Stiffness of right knee, not elsewhere classified: Secondary | ICD-10-CM | POA: Diagnosis not present

## 2018-07-28 DIAGNOSIS — M25561 Pain in right knee: Secondary | ICD-10-CM | POA: Diagnosis not present

## 2018-07-28 DIAGNOSIS — Z96651 Presence of right artificial knee joint: Secondary | ICD-10-CM | POA: Diagnosis not present

## 2018-07-28 DIAGNOSIS — M25661 Stiffness of right knee, not elsewhere classified: Secondary | ICD-10-CM | POA: Diagnosis not present

## 2018-07-31 DIAGNOSIS — M25661 Stiffness of right knee, not elsewhere classified: Secondary | ICD-10-CM | POA: Diagnosis not present

## 2018-07-31 DIAGNOSIS — M25561 Pain in right knee: Secondary | ICD-10-CM | POA: Diagnosis not present

## 2018-07-31 DIAGNOSIS — Z96651 Presence of right artificial knee joint: Secondary | ICD-10-CM | POA: Diagnosis not present

## 2018-08-04 DIAGNOSIS — M25561 Pain in right knee: Secondary | ICD-10-CM | POA: Diagnosis not present

## 2018-08-04 DIAGNOSIS — Z96651 Presence of right artificial knee joint: Secondary | ICD-10-CM | POA: Diagnosis not present

## 2018-08-04 DIAGNOSIS — M25661 Stiffness of right knee, not elsewhere classified: Secondary | ICD-10-CM | POA: Diagnosis not present

## 2018-08-06 DIAGNOSIS — Z96651 Presence of right artificial knee joint: Secondary | ICD-10-CM | POA: Diagnosis not present

## 2018-08-06 DIAGNOSIS — M25561 Pain in right knee: Secondary | ICD-10-CM | POA: Diagnosis not present

## 2018-08-06 DIAGNOSIS — M25661 Stiffness of right knee, not elsewhere classified: Secondary | ICD-10-CM | POA: Diagnosis not present

## 2018-08-11 DIAGNOSIS — M25561 Pain in right knee: Secondary | ICD-10-CM | POA: Diagnosis not present

## 2018-08-11 DIAGNOSIS — Z96651 Presence of right artificial knee joint: Secondary | ICD-10-CM | POA: Diagnosis not present

## 2018-08-11 DIAGNOSIS — M25661 Stiffness of right knee, not elsewhere classified: Secondary | ICD-10-CM | POA: Diagnosis not present

## 2018-08-13 DIAGNOSIS — M25561 Pain in right knee: Secondary | ICD-10-CM | POA: Diagnosis not present

## 2018-08-13 DIAGNOSIS — M25661 Stiffness of right knee, not elsewhere classified: Secondary | ICD-10-CM | POA: Diagnosis not present

## 2018-08-13 DIAGNOSIS — Z96651 Presence of right artificial knee joint: Secondary | ICD-10-CM | POA: Diagnosis not present

## 2018-08-18 DIAGNOSIS — M25561 Pain in right knee: Secondary | ICD-10-CM | POA: Diagnosis not present

## 2018-08-18 DIAGNOSIS — M25661 Stiffness of right knee, not elsewhere classified: Secondary | ICD-10-CM | POA: Diagnosis not present

## 2018-08-18 DIAGNOSIS — Z96651 Presence of right artificial knee joint: Secondary | ICD-10-CM | POA: Diagnosis not present

## 2018-08-20 DIAGNOSIS — M25561 Pain in right knee: Secondary | ICD-10-CM | POA: Diagnosis not present

## 2018-08-20 DIAGNOSIS — Z96651 Presence of right artificial knee joint: Secondary | ICD-10-CM | POA: Diagnosis not present

## 2018-08-20 DIAGNOSIS — M25661 Stiffness of right knee, not elsewhere classified: Secondary | ICD-10-CM | POA: Diagnosis not present

## 2018-08-22 DIAGNOSIS — Z20828 Contact with and (suspected) exposure to other viral communicable diseases: Secondary | ICD-10-CM | POA: Diagnosis not present

## 2018-09-02 DIAGNOSIS — M25512 Pain in left shoulder: Secondary | ICD-10-CM | POA: Diagnosis not present

## 2018-09-02 DIAGNOSIS — M1712 Unilateral primary osteoarthritis, left knee: Secondary | ICD-10-CM | POA: Diagnosis not present

## 2018-09-02 DIAGNOSIS — M222X2 Patellofemoral disorders, left knee: Secondary | ICD-10-CM | POA: Diagnosis not present

## 2018-09-02 DIAGNOSIS — M25762 Osteophyte, left knee: Secondary | ICD-10-CM | POA: Diagnosis not present

## 2018-09-09 DIAGNOSIS — H52203 Unspecified astigmatism, bilateral: Secondary | ICD-10-CM | POA: Diagnosis not present

## 2018-09-09 DIAGNOSIS — H25013 Cortical age-related cataract, bilateral: Secondary | ICD-10-CM | POA: Diagnosis not present

## 2018-09-15 DIAGNOSIS — M25561 Pain in right knee: Secondary | ICD-10-CM | POA: Diagnosis not present

## 2018-11-04 DIAGNOSIS — Z6829 Body mass index (BMI) 29.0-29.9, adult: Secondary | ICD-10-CM | POA: Diagnosis not present

## 2018-11-04 DIAGNOSIS — I10 Essential (primary) hypertension: Secondary | ICD-10-CM | POA: Diagnosis not present

## 2018-11-04 DIAGNOSIS — E1165 Type 2 diabetes mellitus with hyperglycemia: Secondary | ICD-10-CM | POA: Diagnosis not present

## 2018-11-05 DIAGNOSIS — M1712 Unilateral primary osteoarthritis, left knee: Secondary | ICD-10-CM | POA: Diagnosis not present

## 2018-11-05 DIAGNOSIS — M25562 Pain in left knee: Secondary | ICD-10-CM | POA: Diagnosis not present

## 2018-11-05 DIAGNOSIS — M25762 Osteophyte, left knee: Secondary | ICD-10-CM | POA: Diagnosis not present

## 2018-11-05 DIAGNOSIS — M222X2 Patellofemoral disorders, left knee: Secondary | ICD-10-CM | POA: Diagnosis not present

## 2018-11-05 DIAGNOSIS — R2689 Other abnormalities of gait and mobility: Secondary | ICD-10-CM | POA: Diagnosis not present

## 2018-11-17 DIAGNOSIS — E1169 Type 2 diabetes mellitus with other specified complication: Secondary | ICD-10-CM | POA: Diagnosis not present

## 2018-11-17 DIAGNOSIS — I10 Essential (primary) hypertension: Secondary | ICD-10-CM | POA: Diagnosis not present

## 2018-12-02 DIAGNOSIS — Z1389 Encounter for screening for other disorder: Secondary | ICD-10-CM | POA: Diagnosis not present

## 2018-12-02 DIAGNOSIS — I10 Essential (primary) hypertension: Secondary | ICD-10-CM | POA: Diagnosis not present

## 2018-12-02 DIAGNOSIS — E669 Obesity, unspecified: Secondary | ICD-10-CM | POA: Diagnosis not present

## 2018-12-02 DIAGNOSIS — Z125 Encounter for screening for malignant neoplasm of prostate: Secondary | ICD-10-CM | POA: Diagnosis not present

## 2018-12-02 DIAGNOSIS — E1169 Type 2 diabetes mellitus with other specified complication: Secondary | ICD-10-CM | POA: Diagnosis not present

## 2018-12-02 DIAGNOSIS — Z0000 Encounter for general adult medical examination without abnormal findings: Secondary | ICD-10-CM | POA: Diagnosis not present

## 2018-12-02 DIAGNOSIS — E1165 Type 2 diabetes mellitus with hyperglycemia: Secondary | ICD-10-CM | POA: Diagnosis not present

## 2018-12-02 DIAGNOSIS — Z23 Encounter for immunization: Secondary | ICD-10-CM | POA: Diagnosis not present

## 2018-12-11 DIAGNOSIS — M1712 Unilateral primary osteoarthritis, left knee: Secondary | ICD-10-CM | POA: Diagnosis not present

## 2018-12-11 DIAGNOSIS — M25561 Pain in right knee: Secondary | ICD-10-CM | POA: Diagnosis not present

## 2018-12-11 DIAGNOSIS — M25562 Pain in left knee: Secondary | ICD-10-CM | POA: Diagnosis not present

## 2019-01-07 DIAGNOSIS — M25562 Pain in left knee: Secondary | ICD-10-CM | POA: Diagnosis not present

## 2019-01-07 DIAGNOSIS — M1712 Unilateral primary osteoarthritis, left knee: Secondary | ICD-10-CM | POA: Diagnosis not present

## 2019-01-07 DIAGNOSIS — M25762 Osteophyte, left knee: Secondary | ICD-10-CM | POA: Diagnosis not present

## 2019-04-22 DIAGNOSIS — M1712 Unilateral primary osteoarthritis, left knee: Secondary | ICD-10-CM | POA: Diagnosis not present

## 2019-04-22 DIAGNOSIS — M25762 Osteophyte, left knee: Secondary | ICD-10-CM | POA: Diagnosis not present

## 2019-04-22 DIAGNOSIS — M25562 Pain in left knee: Secondary | ICD-10-CM | POA: Diagnosis not present

## 2019-04-23 DIAGNOSIS — I10 Essential (primary) hypertension: Secondary | ICD-10-CM | POA: Diagnosis not present

## 2019-04-23 DIAGNOSIS — E1169 Type 2 diabetes mellitus with other specified complication: Secondary | ICD-10-CM | POA: Diagnosis not present

## 2019-04-29 DIAGNOSIS — M1712 Unilateral primary osteoarthritis, left knee: Secondary | ICD-10-CM | POA: Diagnosis not present

## 2019-04-29 DIAGNOSIS — M25562 Pain in left knee: Secondary | ICD-10-CM | POA: Diagnosis not present

## 2019-04-29 DIAGNOSIS — M25762 Osteophyte, left knee: Secondary | ICD-10-CM | POA: Diagnosis not present

## 2019-05-05 DIAGNOSIS — Z113 Encounter for screening for infections with a predominantly sexual mode of transmission: Secondary | ICD-10-CM | POA: Diagnosis not present

## 2019-05-06 DIAGNOSIS — M1712 Unilateral primary osteoarthritis, left knee: Secondary | ICD-10-CM | POA: Diagnosis not present

## 2019-05-06 DIAGNOSIS — M25762 Osteophyte, left knee: Secondary | ICD-10-CM | POA: Diagnosis not present

## 2019-05-06 DIAGNOSIS — M25562 Pain in left knee: Secondary | ICD-10-CM | POA: Diagnosis not present

## 2019-05-13 DIAGNOSIS — M1712 Unilateral primary osteoarthritis, left knee: Secondary | ICD-10-CM | POA: Diagnosis not present

## 2019-05-13 DIAGNOSIS — M25562 Pain in left knee: Secondary | ICD-10-CM | POA: Diagnosis not present

## 2019-05-13 DIAGNOSIS — M25762 Osteophyte, left knee: Secondary | ICD-10-CM | POA: Diagnosis not present

## 2019-05-28 DIAGNOSIS — M222X2 Patellofemoral disorders, left knee: Secondary | ICD-10-CM | POA: Diagnosis not present

## 2019-05-28 DIAGNOSIS — M25562 Pain in left knee: Secondary | ICD-10-CM | POA: Diagnosis not present

## 2019-05-28 DIAGNOSIS — R2689 Other abnormalities of gait and mobility: Secondary | ICD-10-CM | POA: Diagnosis not present

## 2019-05-28 DIAGNOSIS — M25762 Osteophyte, left knee: Secondary | ICD-10-CM | POA: Diagnosis not present

## 2019-05-28 DIAGNOSIS — M1712 Unilateral primary osteoarthritis, left knee: Secondary | ICD-10-CM | POA: Diagnosis not present

## 2019-06-03 DIAGNOSIS — M25762 Osteophyte, left knee: Secondary | ICD-10-CM | POA: Diagnosis not present

## 2019-08-03 DIAGNOSIS — Z113 Encounter for screening for infections with a predominantly sexual mode of transmission: Secondary | ICD-10-CM | POA: Diagnosis not present

## 2019-08-05 DIAGNOSIS — M25762 Osteophyte, left knee: Secondary | ICD-10-CM | POA: Diagnosis not present

## 2019-08-25 DIAGNOSIS — E1169 Type 2 diabetes mellitus with other specified complication: Secondary | ICD-10-CM | POA: Diagnosis not present

## 2019-08-25 DIAGNOSIS — Z7984 Long term (current) use of oral hypoglycemic drugs: Secondary | ICD-10-CM | POA: Diagnosis not present

## 2019-08-25 DIAGNOSIS — I10 Essential (primary) hypertension: Secondary | ICD-10-CM | POA: Diagnosis not present

## 2019-08-25 DIAGNOSIS — J301 Allergic rhinitis due to pollen: Secondary | ICD-10-CM | POA: Diagnosis not present

## 2019-09-15 DIAGNOSIS — E119 Type 2 diabetes mellitus without complications: Secondary | ICD-10-CM | POA: Diagnosis not present

## 2019-09-15 DIAGNOSIS — H524 Presbyopia: Secondary | ICD-10-CM | POA: Diagnosis not present

## 2019-10-22 DIAGNOSIS — Z471 Aftercare following joint replacement surgery: Secondary | ICD-10-CM | POA: Diagnosis not present

## 2019-10-22 DIAGNOSIS — M25551 Pain in right hip: Secondary | ICD-10-CM | POA: Diagnosis not present

## 2019-10-22 DIAGNOSIS — Z96651 Presence of right artificial knee joint: Secondary | ICD-10-CM | POA: Diagnosis not present

## 2019-10-22 DIAGNOSIS — M7061 Trochanteric bursitis, right hip: Secondary | ICD-10-CM | POA: Diagnosis not present

## 2019-10-22 DIAGNOSIS — M25661 Stiffness of right knee, not elsewhere classified: Secondary | ICD-10-CM | POA: Diagnosis not present

## 2019-11-18 DIAGNOSIS — M25762 Osteophyte, left knee: Secondary | ICD-10-CM | POA: Diagnosis not present

## 2019-11-18 DIAGNOSIS — M67362 Transient synovitis, left knee: Secondary | ICD-10-CM | POA: Diagnosis not present

## 2019-11-25 DIAGNOSIS — M1712 Unilateral primary osteoarthritis, left knee: Secondary | ICD-10-CM | POA: Diagnosis not present

## 2019-12-02 DIAGNOSIS — M1712 Unilateral primary osteoarthritis, left knee: Secondary | ICD-10-CM | POA: Diagnosis not present

## 2019-12-09 DIAGNOSIS — L304 Erythema intertrigo: Secondary | ICD-10-CM | POA: Diagnosis not present

## 2019-12-09 DIAGNOSIS — I10 Essential (primary) hypertension: Secondary | ICD-10-CM | POA: Diagnosis not present

## 2019-12-09 DIAGNOSIS — E1169 Type 2 diabetes mellitus with other specified complication: Secondary | ICD-10-CM | POA: Diagnosis not present

## 2019-12-09 DIAGNOSIS — Z113 Encounter for screening for infections with a predominantly sexual mode of transmission: Secondary | ICD-10-CM | POA: Diagnosis not present

## 2019-12-09 DIAGNOSIS — Z1389 Encounter for screening for other disorder: Secondary | ICD-10-CM | POA: Diagnosis not present

## 2019-12-09 DIAGNOSIS — Z0000 Encounter for general adult medical examination without abnormal findings: Secondary | ICD-10-CM | POA: Diagnosis not present

## 2019-12-09 DIAGNOSIS — M1712 Unilateral primary osteoarthritis, left knee: Secondary | ICD-10-CM | POA: Diagnosis not present

## 2019-12-16 DIAGNOSIS — M1712 Unilateral primary osteoarthritis, left knee: Secondary | ICD-10-CM | POA: Diagnosis not present

## 2019-12-23 DIAGNOSIS — M1712 Unilateral primary osteoarthritis, left knee: Secondary | ICD-10-CM | POA: Diagnosis not present

## 2019-12-30 DIAGNOSIS — Z23 Encounter for immunization: Secondary | ICD-10-CM | POA: Diagnosis not present

## 2020-01-19 DIAGNOSIS — Z23 Encounter for immunization: Secondary | ICD-10-CM | POA: Diagnosis not present

## 2020-01-28 DIAGNOSIS — E1169 Type 2 diabetes mellitus with other specified complication: Secondary | ICD-10-CM | POA: Diagnosis not present

## 2020-01-28 DIAGNOSIS — E119 Type 2 diabetes mellitus without complications: Secondary | ICD-10-CM | POA: Diagnosis not present

## 2020-01-28 DIAGNOSIS — M179 Osteoarthritis of knee, unspecified: Secondary | ICD-10-CM | POA: Diagnosis not present

## 2020-01-28 DIAGNOSIS — I10 Essential (primary) hypertension: Secondary | ICD-10-CM | POA: Diagnosis not present

## 2020-02-17 DIAGNOSIS — M67362 Transient synovitis, left knee: Secondary | ICD-10-CM | POA: Diagnosis not present

## 2020-04-21 DIAGNOSIS — I10 Essential (primary) hypertension: Secondary | ICD-10-CM | POA: Diagnosis not present

## 2020-04-21 DIAGNOSIS — M179 Osteoarthritis of knee, unspecified: Secondary | ICD-10-CM | POA: Diagnosis not present

## 2020-04-21 DIAGNOSIS — E1169 Type 2 diabetes mellitus with other specified complication: Secondary | ICD-10-CM | POA: Diagnosis not present

## 2020-05-18 DIAGNOSIS — M25762 Osteophyte, left knee: Secondary | ICD-10-CM | POA: Diagnosis not present

## 2020-05-31 DIAGNOSIS — F5101 Primary insomnia: Secondary | ICD-10-CM | POA: Diagnosis not present

## 2020-05-31 DIAGNOSIS — I10 Essential (primary) hypertension: Secondary | ICD-10-CM | POA: Diagnosis not present

## 2020-05-31 DIAGNOSIS — E1169 Type 2 diabetes mellitus with other specified complication: Secondary | ICD-10-CM | POA: Diagnosis not present

## 2020-05-31 DIAGNOSIS — J302 Other seasonal allergic rhinitis: Secondary | ICD-10-CM | POA: Diagnosis not present

## 2020-05-31 DIAGNOSIS — Z113 Encounter for screening for infections with a predominantly sexual mode of transmission: Secondary | ICD-10-CM | POA: Diagnosis not present

## 2020-06-22 DIAGNOSIS — M1712 Unilateral primary osteoarthritis, left knee: Secondary | ICD-10-CM | POA: Diagnosis not present

## 2020-06-29 DIAGNOSIS — M1712 Unilateral primary osteoarthritis, left knee: Secondary | ICD-10-CM | POA: Diagnosis not present

## 2020-07-06 DIAGNOSIS — M1712 Unilateral primary osteoarthritis, left knee: Secondary | ICD-10-CM | POA: Diagnosis not present

## 2020-07-13 DIAGNOSIS — M1712 Unilateral primary osteoarthritis, left knee: Secondary | ICD-10-CM | POA: Diagnosis not present

## 2020-07-20 DIAGNOSIS — M1712 Unilateral primary osteoarthritis, left knee: Secondary | ICD-10-CM | POA: Diagnosis not present

## 2020-07-26 DIAGNOSIS — E1169 Type 2 diabetes mellitus with other specified complication: Secondary | ICD-10-CM | POA: Diagnosis not present

## 2020-07-26 DIAGNOSIS — M179 Osteoarthritis of knee, unspecified: Secondary | ICD-10-CM | POA: Diagnosis not present

## 2020-07-26 DIAGNOSIS — E119 Type 2 diabetes mellitus without complications: Secondary | ICD-10-CM | POA: Diagnosis not present

## 2020-07-26 DIAGNOSIS — I10 Essential (primary) hypertension: Secondary | ICD-10-CM | POA: Diagnosis not present

## 2020-08-17 DIAGNOSIS — M25562 Pain in left knee: Secondary | ICD-10-CM | POA: Diagnosis not present

## 2020-08-17 DIAGNOSIS — M1712 Unilateral primary osteoarthritis, left knee: Secondary | ICD-10-CM | POA: Diagnosis not present

## 2020-08-17 DIAGNOSIS — M67362 Transient synovitis, left knee: Secondary | ICD-10-CM | POA: Diagnosis not present

## 2020-08-17 DIAGNOSIS — M25762 Osteophyte, left knee: Secondary | ICD-10-CM | POA: Diagnosis not present

## 2020-09-19 DIAGNOSIS — H524 Presbyopia: Secondary | ICD-10-CM | POA: Diagnosis not present

## 2020-09-19 DIAGNOSIS — E119 Type 2 diabetes mellitus without complications: Secondary | ICD-10-CM | POA: Diagnosis not present

## 2020-09-19 DIAGNOSIS — H25013 Cortical age-related cataract, bilateral: Secondary | ICD-10-CM | POA: Diagnosis not present

## 2020-09-23 DIAGNOSIS — M179 Osteoarthritis of knee, unspecified: Secondary | ICD-10-CM | POA: Diagnosis not present

## 2020-09-23 DIAGNOSIS — E1169 Type 2 diabetes mellitus with other specified complication: Secondary | ICD-10-CM | POA: Diagnosis not present

## 2020-09-23 DIAGNOSIS — I10 Essential (primary) hypertension: Secondary | ICD-10-CM | POA: Diagnosis not present

## 2020-09-23 DIAGNOSIS — E119 Type 2 diabetes mellitus without complications: Secondary | ICD-10-CM | POA: Diagnosis not present

## 2020-10-09 DIAGNOSIS — Z20822 Contact with and (suspected) exposure to covid-19: Secondary | ICD-10-CM | POA: Diagnosis not present

## 2020-12-06 DIAGNOSIS — M13171 Monoarthritis, not elsewhere classified, right ankle and foot: Secondary | ICD-10-CM | POA: Diagnosis not present

## 2020-12-09 ENCOUNTER — Ambulatory Visit: Payer: Medicare Other

## 2020-12-23 DIAGNOSIS — M109 Gout, unspecified: Secondary | ICD-10-CM | POA: Diagnosis not present

## 2021-01-25 DIAGNOSIS — M1712 Unilateral primary osteoarthritis, left knee: Secondary | ICD-10-CM | POA: Diagnosis not present

## 2021-02-01 ENCOUNTER — Other Ambulatory Visit: Payer: Self-pay | Admitting: Internal Medicine

## 2021-02-01 ENCOUNTER — Ambulatory Visit
Admission: RE | Admit: 2021-02-01 | Discharge: 2021-02-01 | Disposition: A | Discharge status: Home / Self Care | Payer: Medicare Other | Source: Ambulatory Visit | Attending: Internal Medicine | Admitting: Internal Medicine

## 2021-02-01 ENCOUNTER — Other Ambulatory Visit: Payer: Self-pay

## 2021-02-01 DIAGNOSIS — Z0000 Encounter for general adult medical examination without abnormal findings: Secondary | ICD-10-CM | POA: Diagnosis not present

## 2021-02-01 DIAGNOSIS — M79672 Pain in left foot: Secondary | ICD-10-CM | POA: Diagnosis not present

## 2021-02-01 DIAGNOSIS — M109 Gout, unspecified: Secondary | ICD-10-CM | POA: Diagnosis not present

## 2021-02-01 DIAGNOSIS — Z125 Encounter for screening for malignant neoplasm of prostate: Secondary | ICD-10-CM | POA: Diagnosis not present

## 2021-02-01 DIAGNOSIS — M1712 Unilateral primary osteoarthritis, left knee: Secondary | ICD-10-CM | POA: Diagnosis not present

## 2021-02-01 DIAGNOSIS — I10 Essential (primary) hypertension: Secondary | ICD-10-CM | POA: Diagnosis not present

## 2021-02-01 DIAGNOSIS — M7732 Calcaneal spur, left foot: Secondary | ICD-10-CM | POA: Diagnosis not present

## 2021-02-01 DIAGNOSIS — Z1389 Encounter for screening for other disorder: Secondary | ICD-10-CM | POA: Diagnosis not present

## 2021-02-01 DIAGNOSIS — M19072 Primary osteoarthritis, left ankle and foot: Secondary | ICD-10-CM | POA: Diagnosis not present

## 2021-02-01 DIAGNOSIS — E1169 Type 2 diabetes mellitus with other specified complication: Secondary | ICD-10-CM | POA: Diagnosis not present

## 2021-02-09 DIAGNOSIS — M1712 Unilateral primary osteoarthritis, left knee: Secondary | ICD-10-CM | POA: Diagnosis not present

## 2021-02-15 DIAGNOSIS — M1712 Unilateral primary osteoarthritis, left knee: Secondary | ICD-10-CM | POA: Diagnosis not present

## 2021-02-22 DIAGNOSIS — Z20828 Contact with and (suspected) exposure to other viral communicable diseases: Secondary | ICD-10-CM | POA: Diagnosis not present

## 2021-03-06 ENCOUNTER — Other Ambulatory Visit: Payer: Self-pay | Admitting: Internal Medicine

## 2021-03-06 ENCOUNTER — Ambulatory Visit
Admission: RE | Admit: 2021-03-06 | Discharge: 2021-03-06 | Disposition: A | Discharge status: Home / Self Care | Payer: Medicare Other | Source: Ambulatory Visit | Attending: Internal Medicine | Admitting: Internal Medicine

## 2021-03-06 DIAGNOSIS — R0789 Other chest pain: Secondary | ICD-10-CM

## 2021-03-06 DIAGNOSIS — M546 Pain in thoracic spine: Secondary | ICD-10-CM | POA: Diagnosis not present

## 2021-03-06 DIAGNOSIS — M549 Dorsalgia, unspecified: Secondary | ICD-10-CM | POA: Diagnosis not present

## 2021-04-04 DIAGNOSIS — M1712 Unilateral primary osteoarthritis, left knee: Secondary | ICD-10-CM | POA: Diagnosis not present

## 2021-04-04 DIAGNOSIS — M25561 Pain in right knee: Secondary | ICD-10-CM | POA: Diagnosis not present

## 2021-04-07 DIAGNOSIS — Z01818 Encounter for other preprocedural examination: Secondary | ICD-10-CM | POA: Diagnosis not present

## 2021-04-07 DIAGNOSIS — M25862 Other specified joint disorders, left knee: Secondary | ICD-10-CM | POA: Diagnosis not present

## 2021-04-07 DIAGNOSIS — E1169 Type 2 diabetes mellitus with other specified complication: Secondary | ICD-10-CM | POA: Diagnosis not present

## 2021-04-24 DIAGNOSIS — M1712 Unilateral primary osteoarthritis, left knee: Secondary | ICD-10-CM | POA: Diagnosis not present

## 2021-04-25 DIAGNOSIS — M1712 Unilateral primary osteoarthritis, left knee: Secondary | ICD-10-CM | POA: Diagnosis not present

## 2021-04-27 DIAGNOSIS — E119 Type 2 diabetes mellitus without complications: Secondary | ICD-10-CM | POA: Diagnosis not present

## 2021-04-27 DIAGNOSIS — I10 Essential (primary) hypertension: Secondary | ICD-10-CM | POA: Diagnosis not present

## 2021-04-27 DIAGNOSIS — M1712 Unilateral primary osteoarthritis, left knee: Secondary | ICD-10-CM | POA: Diagnosis not present

## 2021-04-27 DIAGNOSIS — E1169 Type 2 diabetes mellitus with other specified complication: Secondary | ICD-10-CM | POA: Diagnosis not present

## 2021-05-08 DIAGNOSIS — G8918 Other acute postprocedural pain: Secondary | ICD-10-CM | POA: Diagnosis not present

## 2021-05-08 DIAGNOSIS — M1712 Unilateral primary osteoarthritis, left knee: Secondary | ICD-10-CM | POA: Diagnosis not present

## 2021-05-08 DIAGNOSIS — Z96652 Presence of left artificial knee joint: Secondary | ICD-10-CM | POA: Diagnosis not present

## 2021-05-09 DIAGNOSIS — Z9181 History of falling: Secondary | ICD-10-CM | POA: Diagnosis not present

## 2021-05-09 DIAGNOSIS — Z7982 Long term (current) use of aspirin: Secondary | ICD-10-CM | POA: Diagnosis not present

## 2021-05-09 DIAGNOSIS — Z471 Aftercare following joint replacement surgery: Secondary | ICD-10-CM | POA: Diagnosis not present

## 2021-05-09 DIAGNOSIS — E119 Type 2 diabetes mellitus without complications: Secondary | ICD-10-CM | POA: Diagnosis not present

## 2021-05-09 DIAGNOSIS — Z96653 Presence of artificial knee joint, bilateral: Secondary | ICD-10-CM | POA: Diagnosis not present

## 2021-05-09 DIAGNOSIS — Z7984 Long term (current) use of oral hypoglycemic drugs: Secondary | ICD-10-CM | POA: Diagnosis not present

## 2021-05-09 DIAGNOSIS — I10 Essential (primary) hypertension: Secondary | ICD-10-CM | POA: Diagnosis not present

## 2021-05-11 DIAGNOSIS — Z471 Aftercare following joint replacement surgery: Secondary | ICD-10-CM | POA: Diagnosis not present

## 2021-05-11 DIAGNOSIS — I10 Essential (primary) hypertension: Secondary | ICD-10-CM | POA: Diagnosis not present

## 2021-05-11 DIAGNOSIS — Z7984 Long term (current) use of oral hypoglycemic drugs: Secondary | ICD-10-CM | POA: Diagnosis not present

## 2021-05-11 DIAGNOSIS — Z7982 Long term (current) use of aspirin: Secondary | ICD-10-CM | POA: Diagnosis not present

## 2021-05-11 DIAGNOSIS — Z96653 Presence of artificial knee joint, bilateral: Secondary | ICD-10-CM | POA: Diagnosis not present

## 2021-05-11 DIAGNOSIS — E119 Type 2 diabetes mellitus without complications: Secondary | ICD-10-CM | POA: Diagnosis not present

## 2021-05-12 DIAGNOSIS — Z7984 Long term (current) use of oral hypoglycemic drugs: Secondary | ICD-10-CM | POA: Diagnosis not present

## 2021-05-12 DIAGNOSIS — Z471 Aftercare following joint replacement surgery: Secondary | ICD-10-CM | POA: Diagnosis not present

## 2021-05-12 DIAGNOSIS — Z20822 Contact with and (suspected) exposure to covid-19: Secondary | ICD-10-CM | POA: Diagnosis not present

## 2021-05-12 DIAGNOSIS — Z96653 Presence of artificial knee joint, bilateral: Secondary | ICD-10-CM | POA: Diagnosis not present

## 2021-05-12 DIAGNOSIS — I10 Essential (primary) hypertension: Secondary | ICD-10-CM | POA: Diagnosis not present

## 2021-05-12 DIAGNOSIS — E119 Type 2 diabetes mellitus without complications: Secondary | ICD-10-CM | POA: Diagnosis not present

## 2021-05-12 DIAGNOSIS — Z7982 Long term (current) use of aspirin: Secondary | ICD-10-CM | POA: Diagnosis not present

## 2021-05-15 DIAGNOSIS — E119 Type 2 diabetes mellitus without complications: Secondary | ICD-10-CM | POA: Diagnosis not present

## 2021-05-15 DIAGNOSIS — I10 Essential (primary) hypertension: Secondary | ICD-10-CM | POA: Diagnosis not present

## 2021-05-15 DIAGNOSIS — Z96653 Presence of artificial knee joint, bilateral: Secondary | ICD-10-CM | POA: Diagnosis not present

## 2021-05-15 DIAGNOSIS — Z7982 Long term (current) use of aspirin: Secondary | ICD-10-CM | POA: Diagnosis not present

## 2021-05-15 DIAGNOSIS — Z7984 Long term (current) use of oral hypoglycemic drugs: Secondary | ICD-10-CM | POA: Diagnosis not present

## 2021-05-15 DIAGNOSIS — Z471 Aftercare following joint replacement surgery: Secondary | ICD-10-CM | POA: Diagnosis not present

## 2021-05-17 DIAGNOSIS — Z7984 Long term (current) use of oral hypoglycemic drugs: Secondary | ICD-10-CM | POA: Diagnosis not present

## 2021-05-17 DIAGNOSIS — I10 Essential (primary) hypertension: Secondary | ICD-10-CM | POA: Diagnosis not present

## 2021-05-17 DIAGNOSIS — Z96653 Presence of artificial knee joint, bilateral: Secondary | ICD-10-CM | POA: Diagnosis not present

## 2021-05-17 DIAGNOSIS — Z471 Aftercare following joint replacement surgery: Secondary | ICD-10-CM | POA: Diagnosis not present

## 2021-05-17 DIAGNOSIS — E119 Type 2 diabetes mellitus without complications: Secondary | ICD-10-CM | POA: Diagnosis not present

## 2021-05-17 DIAGNOSIS — Z7982 Long term (current) use of aspirin: Secondary | ICD-10-CM | POA: Diagnosis not present

## 2021-05-19 DIAGNOSIS — Z96653 Presence of artificial knee joint, bilateral: Secondary | ICD-10-CM | POA: Diagnosis not present

## 2021-05-19 DIAGNOSIS — Z7982 Long term (current) use of aspirin: Secondary | ICD-10-CM | POA: Diagnosis not present

## 2021-05-19 DIAGNOSIS — Z7984 Long term (current) use of oral hypoglycemic drugs: Secondary | ICD-10-CM | POA: Diagnosis not present

## 2021-05-19 DIAGNOSIS — Z471 Aftercare following joint replacement surgery: Secondary | ICD-10-CM | POA: Diagnosis not present

## 2021-05-19 DIAGNOSIS — E119 Type 2 diabetes mellitus without complications: Secondary | ICD-10-CM | POA: Diagnosis not present

## 2021-05-19 DIAGNOSIS — I10 Essential (primary) hypertension: Secondary | ICD-10-CM | POA: Diagnosis not present

## 2021-05-22 DIAGNOSIS — M25662 Stiffness of left knee, not elsewhere classified: Secondary | ICD-10-CM | POA: Diagnosis not present

## 2021-05-22 DIAGNOSIS — R531 Weakness: Secondary | ICD-10-CM | POA: Diagnosis not present

## 2021-05-22 DIAGNOSIS — Z96652 Presence of left artificial knee joint: Secondary | ICD-10-CM | POA: Diagnosis not present

## 2021-05-22 DIAGNOSIS — Z9889 Other specified postprocedural states: Secondary | ICD-10-CM | POA: Diagnosis not present

## 2021-05-22 DIAGNOSIS — R600 Localized edema: Secondary | ICD-10-CM | POA: Diagnosis not present

## 2021-05-22 DIAGNOSIS — M1712 Unilateral primary osteoarthritis, left knee: Secondary | ICD-10-CM | POA: Diagnosis not present

## 2021-05-25 DIAGNOSIS — R600 Localized edema: Secondary | ICD-10-CM | POA: Diagnosis not present

## 2021-05-25 DIAGNOSIS — M25662 Stiffness of left knee, not elsewhere classified: Secondary | ICD-10-CM | POA: Diagnosis not present

## 2021-05-25 DIAGNOSIS — Z96652 Presence of left artificial knee joint: Secondary | ICD-10-CM | POA: Diagnosis not present

## 2021-05-25 DIAGNOSIS — R531 Weakness: Secondary | ICD-10-CM | POA: Diagnosis not present

## 2021-05-30 DIAGNOSIS — R531 Weakness: Secondary | ICD-10-CM | POA: Diagnosis not present

## 2021-05-30 DIAGNOSIS — Z96652 Presence of left artificial knee joint: Secondary | ICD-10-CM | POA: Diagnosis not present

## 2021-05-30 DIAGNOSIS — M25662 Stiffness of left knee, not elsewhere classified: Secondary | ICD-10-CM | POA: Diagnosis not present

## 2021-05-30 DIAGNOSIS — R600 Localized edema: Secondary | ICD-10-CM | POA: Diagnosis not present

## 2021-06-02 DIAGNOSIS — M25662 Stiffness of left knee, not elsewhere classified: Secondary | ICD-10-CM | POA: Diagnosis not present

## 2021-06-02 DIAGNOSIS — R600 Localized edema: Secondary | ICD-10-CM | POA: Diagnosis not present

## 2021-06-02 DIAGNOSIS — R531 Weakness: Secondary | ICD-10-CM | POA: Diagnosis not present

## 2021-06-02 DIAGNOSIS — Z96652 Presence of left artificial knee joint: Secondary | ICD-10-CM | POA: Diagnosis not present

## 2021-06-06 DIAGNOSIS — R600 Localized edema: Secondary | ICD-10-CM | POA: Diagnosis not present

## 2021-06-06 DIAGNOSIS — Z96652 Presence of left artificial knee joint: Secondary | ICD-10-CM | POA: Diagnosis not present

## 2021-06-06 DIAGNOSIS — M25662 Stiffness of left knee, not elsewhere classified: Secondary | ICD-10-CM | POA: Diagnosis not present

## 2021-06-06 DIAGNOSIS — R531 Weakness: Secondary | ICD-10-CM | POA: Diagnosis not present

## 2021-06-08 DIAGNOSIS — Z96652 Presence of left artificial knee joint: Secondary | ICD-10-CM | POA: Diagnosis not present

## 2021-06-08 DIAGNOSIS — R531 Weakness: Secondary | ICD-10-CM | POA: Diagnosis not present

## 2021-06-08 DIAGNOSIS — M25662 Stiffness of left knee, not elsewhere classified: Secondary | ICD-10-CM | POA: Diagnosis not present

## 2021-06-08 DIAGNOSIS — R600 Localized edema: Secondary | ICD-10-CM | POA: Diagnosis not present

## 2021-06-13 DIAGNOSIS — M25661 Stiffness of right knee, not elsewhere classified: Secondary | ICD-10-CM | POA: Diagnosis not present

## 2021-06-13 DIAGNOSIS — Z96652 Presence of left artificial knee joint: Secondary | ICD-10-CM | POA: Diagnosis not present

## 2021-06-13 DIAGNOSIS — R531 Weakness: Secondary | ICD-10-CM | POA: Diagnosis not present

## 2021-06-13 DIAGNOSIS — R600 Localized edema: Secondary | ICD-10-CM | POA: Diagnosis not present

## 2021-06-15 DIAGNOSIS — Z96652 Presence of left artificial knee joint: Secondary | ICD-10-CM | POA: Diagnosis not present

## 2021-06-15 DIAGNOSIS — M25662 Stiffness of left knee, not elsewhere classified: Secondary | ICD-10-CM | POA: Diagnosis not present

## 2021-06-15 DIAGNOSIS — R531 Weakness: Secondary | ICD-10-CM | POA: Diagnosis not present

## 2021-06-15 DIAGNOSIS — R600 Localized edema: Secondary | ICD-10-CM | POA: Diagnosis not present

## 2021-06-20 DIAGNOSIS — R531 Weakness: Secondary | ICD-10-CM | POA: Diagnosis not present

## 2021-06-20 DIAGNOSIS — R600 Localized edema: Secondary | ICD-10-CM | POA: Diagnosis not present

## 2021-06-20 DIAGNOSIS — M25662 Stiffness of left knee, not elsewhere classified: Secondary | ICD-10-CM | POA: Diagnosis not present

## 2021-06-20 DIAGNOSIS — Z96652 Presence of left artificial knee joint: Secondary | ICD-10-CM | POA: Diagnosis not present

## 2021-06-22 DIAGNOSIS — M25662 Stiffness of left knee, not elsewhere classified: Secondary | ICD-10-CM | POA: Diagnosis not present

## 2021-06-22 DIAGNOSIS — R531 Weakness: Secondary | ICD-10-CM | POA: Diagnosis not present

## 2021-06-22 DIAGNOSIS — Z96652 Presence of left artificial knee joint: Secondary | ICD-10-CM | POA: Diagnosis not present

## 2021-06-22 DIAGNOSIS — R600 Localized edema: Secondary | ICD-10-CM | POA: Diagnosis not present

## 2021-06-27 DIAGNOSIS — R600 Localized edema: Secondary | ICD-10-CM | POA: Diagnosis not present

## 2021-06-27 DIAGNOSIS — M25662 Stiffness of left knee, not elsewhere classified: Secondary | ICD-10-CM | POA: Diagnosis not present

## 2021-06-27 DIAGNOSIS — R531 Weakness: Secondary | ICD-10-CM | POA: Diagnosis not present

## 2021-06-27 DIAGNOSIS — Z96652 Presence of left artificial knee joint: Secondary | ICD-10-CM | POA: Diagnosis not present

## 2021-06-29 DIAGNOSIS — Z96652 Presence of left artificial knee joint: Secondary | ICD-10-CM | POA: Diagnosis not present

## 2021-06-29 DIAGNOSIS — M25662 Stiffness of left knee, not elsewhere classified: Secondary | ICD-10-CM | POA: Diagnosis not present

## 2021-06-29 DIAGNOSIS — R531 Weakness: Secondary | ICD-10-CM | POA: Diagnosis not present

## 2021-06-29 DIAGNOSIS — R600 Localized edema: Secondary | ICD-10-CM | POA: Diagnosis not present

## 2021-07-04 DIAGNOSIS — Z96652 Presence of left artificial knee joint: Secondary | ICD-10-CM | POA: Diagnosis not present

## 2021-07-04 DIAGNOSIS — M25662 Stiffness of left knee, not elsewhere classified: Secondary | ICD-10-CM | POA: Diagnosis not present

## 2021-07-04 DIAGNOSIS — R531 Weakness: Secondary | ICD-10-CM | POA: Diagnosis not present

## 2021-07-04 DIAGNOSIS — R600 Localized edema: Secondary | ICD-10-CM | POA: Diagnosis not present

## 2021-07-05 DIAGNOSIS — Z96652 Presence of left artificial knee joint: Secondary | ICD-10-CM | POA: Diagnosis not present

## 2021-07-05 DIAGNOSIS — R531 Weakness: Secondary | ICD-10-CM | POA: Diagnosis not present

## 2021-07-05 DIAGNOSIS — R600 Localized edema: Secondary | ICD-10-CM | POA: Diagnosis not present

## 2021-07-05 DIAGNOSIS — M25662 Stiffness of left knee, not elsewhere classified: Secondary | ICD-10-CM | POA: Diagnosis not present

## 2021-07-11 DIAGNOSIS — R531 Weakness: Secondary | ICD-10-CM | POA: Diagnosis not present

## 2021-07-11 DIAGNOSIS — Z96652 Presence of left artificial knee joint: Secondary | ICD-10-CM | POA: Diagnosis not present

## 2021-07-11 DIAGNOSIS — R600 Localized edema: Secondary | ICD-10-CM | POA: Diagnosis not present

## 2021-07-11 DIAGNOSIS — M25662 Stiffness of left knee, not elsewhere classified: Secondary | ICD-10-CM | POA: Diagnosis not present

## 2021-07-13 DIAGNOSIS — Z96652 Presence of left artificial knee joint: Secondary | ICD-10-CM | POA: Diagnosis not present

## 2021-07-13 DIAGNOSIS — R531 Weakness: Secondary | ICD-10-CM | POA: Diagnosis not present

## 2021-07-13 DIAGNOSIS — R600 Localized edema: Secondary | ICD-10-CM | POA: Diagnosis not present

## 2021-07-13 DIAGNOSIS — M25662 Stiffness of left knee, not elsewhere classified: Secondary | ICD-10-CM | POA: Diagnosis not present

## 2021-07-17 DIAGNOSIS — M25662 Stiffness of left knee, not elsewhere classified: Secondary | ICD-10-CM | POA: Diagnosis not present

## 2021-07-17 DIAGNOSIS — Z96652 Presence of left artificial knee joint: Secondary | ICD-10-CM | POA: Diagnosis not present

## 2021-07-17 DIAGNOSIS — R600 Localized edema: Secondary | ICD-10-CM | POA: Diagnosis not present

## 2021-07-17 DIAGNOSIS — Z20822 Contact with and (suspected) exposure to covid-19: Secondary | ICD-10-CM | POA: Diagnosis not present

## 2021-07-17 DIAGNOSIS — R531 Weakness: Secondary | ICD-10-CM | POA: Diagnosis not present

## 2021-07-18 DIAGNOSIS — H25013 Cortical age-related cataract, bilateral: Secondary | ICD-10-CM | POA: Diagnosis not present

## 2021-07-18 DIAGNOSIS — H52201 Unspecified astigmatism, right eye: Secondary | ICD-10-CM | POA: Diagnosis not present

## 2021-07-18 DIAGNOSIS — H5203 Hypermetropia, bilateral: Secondary | ICD-10-CM | POA: Diagnosis not present

## 2021-07-18 DIAGNOSIS — E119 Type 2 diabetes mellitus without complications: Secondary | ICD-10-CM | POA: Diagnosis not present

## 2021-07-19 DIAGNOSIS — R531 Weakness: Secondary | ICD-10-CM | POA: Diagnosis not present

## 2021-07-19 DIAGNOSIS — M25662 Stiffness of left knee, not elsewhere classified: Secondary | ICD-10-CM | POA: Diagnosis not present

## 2021-07-19 DIAGNOSIS — Z96652 Presence of left artificial knee joint: Secondary | ICD-10-CM | POA: Diagnosis not present

## 2021-07-19 DIAGNOSIS — R600 Localized edema: Secondary | ICD-10-CM | POA: Diagnosis not present

## 2021-07-25 DIAGNOSIS — R531 Weakness: Secondary | ICD-10-CM | POA: Diagnosis not present

## 2021-07-25 DIAGNOSIS — Z96652 Presence of left artificial knee joint: Secondary | ICD-10-CM | POA: Diagnosis not present

## 2021-07-25 DIAGNOSIS — R600 Localized edema: Secondary | ICD-10-CM | POA: Diagnosis not present

## 2021-07-25 DIAGNOSIS — M25662 Stiffness of left knee, not elsewhere classified: Secondary | ICD-10-CM | POA: Diagnosis not present

## 2021-07-26 DIAGNOSIS — R600 Localized edema: Secondary | ICD-10-CM | POA: Diagnosis not present

## 2021-07-26 DIAGNOSIS — R531 Weakness: Secondary | ICD-10-CM | POA: Diagnosis not present

## 2021-07-26 DIAGNOSIS — M25662 Stiffness of left knee, not elsewhere classified: Secondary | ICD-10-CM | POA: Diagnosis not present

## 2021-07-26 DIAGNOSIS — Z96652 Presence of left artificial knee joint: Secondary | ICD-10-CM | POA: Diagnosis not present

## 2021-08-08 DIAGNOSIS — Z96652 Presence of left artificial knee joint: Secondary | ICD-10-CM | POA: Diagnosis not present

## 2021-08-08 DIAGNOSIS — Z9889 Other specified postprocedural states: Secondary | ICD-10-CM | POA: Diagnosis not present

## 2021-10-02 DIAGNOSIS — Z5181 Encounter for therapeutic drug level monitoring: Secondary | ICD-10-CM | POA: Diagnosis not present

## 2021-10-02 DIAGNOSIS — M1A00X0 Idiopathic chronic gout, unspecified site, without tophus (tophi): Secondary | ICD-10-CM | POA: Diagnosis not present

## 2021-10-02 DIAGNOSIS — M25562 Pain in left knee: Secondary | ICD-10-CM | POA: Diagnosis not present

## 2021-10-02 DIAGNOSIS — E1169 Type 2 diabetes mellitus with other specified complication: Secondary | ICD-10-CM | POA: Diagnosis not present

## 2021-10-02 DIAGNOSIS — I10 Essential (primary) hypertension: Secondary | ICD-10-CM | POA: Diagnosis not present

## 2021-11-09 DIAGNOSIS — M5451 Vertebrogenic low back pain: Secondary | ICD-10-CM | POA: Diagnosis not present

## 2021-11-09 DIAGNOSIS — Z96652 Presence of left artificial knee joint: Secondary | ICD-10-CM | POA: Diagnosis not present

## 2021-11-09 DIAGNOSIS — Z96651 Presence of right artificial knee joint: Secondary | ICD-10-CM | POA: Diagnosis not present

## 2021-11-09 DIAGNOSIS — M5136 Other intervertebral disc degeneration, lumbar region: Secondary | ICD-10-CM | POA: Diagnosis not present

## 2021-11-09 DIAGNOSIS — Z09 Encounter for follow-up examination after completed treatment for conditions other than malignant neoplasm: Secondary | ICD-10-CM | POA: Diagnosis not present

## 2021-11-15 DIAGNOSIS — M1A00X0 Idiopathic chronic gout, unspecified site, without tophus (tophi): Secondary | ICD-10-CM | POA: Diagnosis not present

## 2021-12-18 ENCOUNTER — Other Ambulatory Visit: Payer: Self-pay | Admitting: Internal Medicine

## 2021-12-18 ENCOUNTER — Ambulatory Visit
Admission: RE | Admit: 2021-12-18 | Discharge: 2021-12-18 | Disposition: A | Discharge status: Home / Self Care | Payer: Medicare Other | Source: Ambulatory Visit | Attending: Internal Medicine | Admitting: Internal Medicine

## 2021-12-18 DIAGNOSIS — I10 Essential (primary) hypertension: Secondary | ICD-10-CM | POA: Diagnosis not present

## 2021-12-18 DIAGNOSIS — M546 Pain in thoracic spine: Secondary | ICD-10-CM | POA: Diagnosis not present

## 2021-12-18 DIAGNOSIS — R0781 Pleurodynia: Secondary | ICD-10-CM

## 2021-12-18 DIAGNOSIS — R079 Chest pain, unspecified: Secondary | ICD-10-CM | POA: Diagnosis not present

## 2021-12-18 DIAGNOSIS — M47814 Spondylosis without myelopathy or radiculopathy, thoracic region: Secondary | ICD-10-CM | POA: Diagnosis not present

## 2022-02-15 DIAGNOSIS — Z1331 Encounter for screening for depression: Secondary | ICD-10-CM | POA: Diagnosis not present

## 2022-02-15 DIAGNOSIS — Z0000 Encounter for general adult medical examination without abnormal findings: Secondary | ICD-10-CM | POA: Diagnosis not present

## 2022-02-15 DIAGNOSIS — Z79899 Other long term (current) drug therapy: Secondary | ICD-10-CM | POA: Diagnosis not present

## 2022-02-15 DIAGNOSIS — I10 Essential (primary) hypertension: Secondary | ICD-10-CM | POA: Diagnosis not present

## 2022-02-15 DIAGNOSIS — F5101 Primary insomnia: Secondary | ICD-10-CM | POA: Diagnosis not present

## 2022-02-15 DIAGNOSIS — E1169 Type 2 diabetes mellitus with other specified complication: Secondary | ICD-10-CM | POA: Diagnosis not present

## 2022-02-15 DIAGNOSIS — M109 Gout, unspecified: Secondary | ICD-10-CM | POA: Diagnosis not present

## 2022-06-19 DIAGNOSIS — M5442 Lumbago with sciatica, left side: Secondary | ICD-10-CM | POA: Diagnosis not present

## 2022-06-19 DIAGNOSIS — M25562 Pain in left knee: Secondary | ICD-10-CM | POA: Diagnosis not present

## 2022-07-06 DIAGNOSIS — E1169 Type 2 diabetes mellitus with other specified complication: Secondary | ICD-10-CM | POA: Diagnosis not present

## 2022-07-06 DIAGNOSIS — J0110 Acute frontal sinusitis, unspecified: Secondary | ICD-10-CM | POA: Diagnosis not present

## 2022-07-06 DIAGNOSIS — I10 Essential (primary) hypertension: Secondary | ICD-10-CM | POA: Diagnosis not present

## 2022-07-25 ENCOUNTER — Ambulatory Visit
Admission: RE | Admit: 2022-07-25 | Discharge: 2022-07-25 | Disposition: A | Discharge status: Home / Self Care | Payer: Medicare Other | Source: Ambulatory Visit | Attending: Internal Medicine | Admitting: Internal Medicine

## 2022-07-25 ENCOUNTER — Other Ambulatory Visit: Payer: Self-pay | Admitting: Internal Medicine

## 2022-07-25 DIAGNOSIS — R059 Cough, unspecified: Secondary | ICD-10-CM | POA: Diagnosis not present

## 2022-07-25 DIAGNOSIS — R058 Other specified cough: Secondary | ICD-10-CM

## 2022-07-27 DIAGNOSIS — H5211 Myopia, right eye: Secondary | ICD-10-CM | POA: Diagnosis not present

## 2022-07-27 DIAGNOSIS — E119 Type 2 diabetes mellitus without complications: Secondary | ICD-10-CM | POA: Diagnosis not present

## 2022-10-01 DIAGNOSIS — R079 Chest pain, unspecified: Secondary | ICD-10-CM | POA: Diagnosis not present

## 2022-10-01 DIAGNOSIS — R112 Nausea with vomiting, unspecified: Secondary | ICD-10-CM | POA: Diagnosis not present

## 2022-10-08 DIAGNOSIS — R0789 Other chest pain: Secondary | ICD-10-CM | POA: Diagnosis not present

## 2022-10-08 DIAGNOSIS — R030 Elevated blood-pressure reading, without diagnosis of hypertension: Secondary | ICD-10-CM | POA: Diagnosis not present

## 2022-11-06 DIAGNOSIS — Z09 Encounter for follow-up examination after completed treatment for conditions other than malignant neoplasm: Secondary | ICD-10-CM | POA: Diagnosis not present

## 2022-11-06 DIAGNOSIS — D122 Benign neoplasm of ascending colon: Secondary | ICD-10-CM | POA: Diagnosis not present

## 2022-11-06 DIAGNOSIS — Z86010 Personal history of colonic polyps: Secondary | ICD-10-CM | POA: Diagnosis not present

## 2022-11-08 DIAGNOSIS — D122 Benign neoplasm of ascending colon: Secondary | ICD-10-CM | POA: Diagnosis not present

## 2023-02-04 ENCOUNTER — Encounter (HOSPITAL_BASED_OUTPATIENT_CLINIC_OR_DEPARTMENT_OTHER): Payer: Self-pay | Admitting: Pediatrics

## 2023-02-04 ENCOUNTER — Other Ambulatory Visit: Payer: Self-pay

## 2023-02-04 ENCOUNTER — Emergency Department (HOSPITAL_BASED_OUTPATIENT_CLINIC_OR_DEPARTMENT_OTHER): Payer: Medicare Other

## 2023-02-04 ENCOUNTER — Emergency Department (HOSPITAL_BASED_OUTPATIENT_CLINIC_OR_DEPARTMENT_OTHER)
Admission: EM | Admit: 2023-02-04 | Discharge: 2023-02-04 | Disposition: A | Discharge status: Home / Self Care | Payer: Medicare Other | Attending: Emergency Medicine | Admitting: Emergency Medicine

## 2023-02-04 DIAGNOSIS — H6121 Impacted cerumen, right ear: Secondary | ICD-10-CM | POA: Diagnosis not present

## 2023-02-04 DIAGNOSIS — I10 Essential (primary) hypertension: Secondary | ICD-10-CM | POA: Diagnosis not present

## 2023-02-04 DIAGNOSIS — R059 Cough, unspecified: Secondary | ICD-10-CM | POA: Diagnosis not present

## 2023-02-04 DIAGNOSIS — J029 Acute pharyngitis, unspecified: Secondary | ICD-10-CM | POA: Diagnosis present

## 2023-02-04 DIAGNOSIS — Z20822 Contact with and (suspected) exposure to covid-19: Secondary | ICD-10-CM | POA: Diagnosis not present

## 2023-02-04 DIAGNOSIS — Z79899 Other long term (current) drug therapy: Secondary | ICD-10-CM | POA: Diagnosis not present

## 2023-02-04 DIAGNOSIS — J069 Acute upper respiratory infection, unspecified: Secondary | ICD-10-CM | POA: Diagnosis not present

## 2023-02-04 DIAGNOSIS — Z7982 Long term (current) use of aspirin: Secondary | ICD-10-CM | POA: Insufficient documentation

## 2023-02-04 DIAGNOSIS — E119 Type 2 diabetes mellitus without complications: Secondary | ICD-10-CM | POA: Diagnosis not present

## 2023-02-04 LAB — RESP PANEL BY RT-PCR (RSV, FLU A&B, COVID)  RVPGX2
Influenza A by PCR: NEGATIVE
Influenza B by PCR: NEGATIVE
Resp Syncytial Virus by PCR: NEGATIVE
SARS Coronavirus 2 by RT PCR: NEGATIVE

## 2023-02-04 MED ORDER — CARBAMIDE PEROXIDE 6.5 % OT SOLN: 5.0000 [drp] | Freq: Two times a day (BID) | OTIC | 0 refills | Status: AC

## 2023-02-04 NOTE — Discharge Instructions (Signed)
Your swab today is negative for COVID, flu, RSV.  Your chest x-ray is normal.  Your symptoms are likely related to a viral respiratory illness.  Please discontinue general over-the-counter cold medications as these can cause your blood pressure to be high.  You can take Mucinex or Coricidin HBP.  Talk to the pharmacist for these medications if you have questions.  Follow-up with your primary care provider for recheck and recheck of your right ear after using eardrops solution.  Return to the ER for severe or concerning symptoms.

## 2023-02-04 NOTE — ED Provider Notes (Signed)
[place] [facility]PARTMENT AT [facility] Provider Note   CSN: [id number] Arrival date & time: 02/04/23  1004     History  Chief Complaint  Patient presents with   Sore Throat   Ear Fullness    Dean Williams is a 72 y.o. male.  72 year old male with past medical history of diabetes and hypertension presents with complaint of sore throat and sneezing onset Thursday (4 days ago), followed with runny nose and cough.  Reports feeling like he has wheezing at night and when laying down.  Patient has been taking OTC cold medications every 4-6 hours without resolution of symptoms. No known sick contacts. States he goes to the gym regularly and questions if not completely changing out of his soap sweating clothing has caused him to become ill.  Reports similar symptoms 1 year ago, provided with unknown medication which resolved symptoms.  Also reports feeling like his ears are clogged, right more so than left.  Used OTC wax treatment without resolution.       Home Medications Prior to Admission medications   Medication Sig Start Date End Date Taking? Authorizing Provider  carbamide peroxide (DEBROX) 6.5 % OTIC solution Place 5 drops into the right ear 2 (two) times daily. 02/04/23  Yes Jeannie Fend, PA-C  amLODipine (NORVASC) 5 MG tablet Take 5 mg by mouth daily.  08/12/14   [provider]  aspirin EC 325 MG tablet Take 1 tablet (325 mg total) by mouth 2 (two) times daily after a meal. Take x 1 month post op to decrease risk of blood clots. 05/09/18   Marshia Ly, PA-C  cetirizine (ZYRTEC) 10 MG tablet Take 10 mg by mouth daily.    [provider]  docusate sodium (COLACE) 100 MG capsule Take 1 capsule (100 mg total) by mouth 2 (two) times daily. 05/09/18   Marshia Ly, PA-C  Misc Natural Products (OSTEO BI-FLEX ADV JOINT SHIELD PO) Take 1-2 tablets by mouth daily.    [provider]  Multiple Vitamins-Minerals (MULTIVITAMIN WITH MINERALS)  tablet Take 1 tablet by mouth daily.    [provider]  oxyCODONE-acetaminophen (PERCOCET/ROXICET) 5-325 MG tablet Take 1-2 tablets by mouth every 6 (six) hours as needed for severe pain. 05/09/18   Marshia Ly, PA-C  tiZANidine (ZANAFLEX) 2 MG tablet Take 1 tablet (2 mg total) by mouth every 8 (eight) hours as needed for muscle spasms. 05/09/18   Marshia Ly, PA-C  traMADol (ULTRAM) 50 MG tablet Take 50 mg by mouth every 6 (six) hours as needed (pain).     [provider]  valACYclovir (VALTREX) 500 MG tablet Take 1,000 mg by mouth 2 (two) times daily as needed (outbreaks).  05/30/14   [provider]  vitamin E 400 UNIT capsule Take 400 Units by mouth daily.    [provider]      Allergies    Patient has no known allergies.    Review of Systems   Review of Systems Negative except as per HPI Physical Exam Updated Vital Signs BP (!) 168/105   Pulse 67   Temp 98.7 F (37.1 C)   Resp 16   Ht 5\' 8"  (1.727 m)   Wt 94.8 kg   SpO2 99%   BMI 31.78 kg/m  Physical Exam Vitals and nursing note reviewed.  Constitutional:      General: He is not in acute distress.    Appearance: He is well-developed. He is not diaphoretic.  HENT:  Head: Normocephalic and atraumatic.     Right Ear: There is impacted cerumen.     Left Ear: Tympanic membrane normal.     Nose: Congestion present.     Mouth/Throat:     Mouth: Mucous membranes are moist.     Pharynx: Uvula midline. No pharyngeal swelling or posterior oropharyngeal erythema.     Tonsils: No tonsillar exudate or tonsillar abscesses.  Cardiovascular:     Rate and Rhythm: Normal rate and regular rhythm.     Heart sounds: Normal heart sounds.  Pulmonary:     Effort: Pulmonary effort is normal.  Musculoskeletal:     Cervical back: Neck supple.  Lymphadenopathy:     Cervical: No cervical adenopathy.  Skin:    General: Skin is warm and dry.     Findings: No erythema or rash.  Neurological:      Mental Status: He is alert and oriented to person, place, and time.  Psychiatric:        Behavior: Behavior normal.     ED Results / Procedures / Treatments   Labs (all labs ordered are listed, but only abnormal results are displayed) Labs Reviewed  RESP PANEL BY RT-PCR (RSV, FLU A&B, COVID)  [id number]    EKG None  Radiology DG Chest 2 View  Result Date: 02/04/2023 CLINICAL DATA:  Cough. EXAM: CHEST - 2 VIEW COMPARISON:  07/25/2022. FINDINGS: Bilateral lung fields are clear. Bilateral costophrenic angles are clear. Normal cardio-mediastinal silhouette. No acute osseous abnormalities. The soft tissues are within normal limits. IMPRESSION: *No active cardiopulmonary disease. Electronically Signed   By: Jules Schick M.D.   On: 02/04/2023 13:58    Procedures Procedures    Medications Ordered in ED Medications - No data to display  ED Course/ Medical Decision Making/ A&P                                 Medical Decision Making Amount and/or Complexity of Data Reviewed Radiology: ordered.  Risk OTC drugs.   72 year old male with URI symptoms as above.  Has nasal congestion, lungs are clear to auscultation.  Blood pressure slightly elevated with O2 sat 100% on room air.  Question if his blood pressure is elevated secondary to OTC cold medication use.  Recommend using Mucinex or Coricidin HBP only, discontinuing other OTC cold medications.  His swab is negative for COVID/flu/RSV.  Regarding his ear complaints, he does have impacted cerumen in the right canal.  Plan is to discharge with [place] and follow-up with PCP removal. Chest x-ray as ordered inter myself shows no acute abnormality. agree with radiology interpretation.  Recommend recheck with PCP.  Can take Mucinex or Coricidin HBP for symptom relief.  Return to ER as needed.        Final Clinical Impression(s) / ED Diagnoses Final diagnoses:  Upper respiratory tract infection, unspecified type  Impacted cerumen of right  ear    Rx / DC Orders ED Discharge Orders          Ordered    carbamide peroxide (DEBROX) 6.5 % OTIC solution  2 times daily        02/04/23 1123              Alden Hipp 02/04/23 1405    Laurence Spates, MD 02/04/23 1958

## 2023-02-04 NOTE — ED Triage Notes (Signed)
C/O sore throat started Thursday; followed by productive cough and some wheezing when laying flat. States using flonase and Sudafed with some relief. C/O on and off stuffiness on right ear x 2 months.

## 2023-02-12 DIAGNOSIS — J069 Acute upper respiratory infection, unspecified: Secondary | ICD-10-CM | POA: Diagnosis not present

## 2023-02-12 DIAGNOSIS — H6123 Impacted cerumen, bilateral: Secondary | ICD-10-CM | POA: Diagnosis not present

## 2023-02-25 DIAGNOSIS — Z0000 Encounter for general adult medical examination without abnormal findings: Secondary | ICD-10-CM | POA: Diagnosis not present

## 2023-02-25 DIAGNOSIS — Z2981 Encounter for HIV pre-exposure prophylaxis: Secondary | ICD-10-CM | POA: Diagnosis not present

## 2023-02-25 DIAGNOSIS — E1169 Type 2 diabetes mellitus with other specified complication: Secondary | ICD-10-CM | POA: Diagnosis not present

## 2023-02-25 DIAGNOSIS — Z23 Encounter for immunization: Secondary | ICD-10-CM | POA: Diagnosis not present

## 2023-02-25 DIAGNOSIS — M1A00X0 Idiopathic chronic gout, unspecified site, without tophus (tophi): Secondary | ICD-10-CM | POA: Diagnosis not present

## 2023-02-25 DIAGNOSIS — M7631 Iliotibial band syndrome, right leg: Secondary | ICD-10-CM | POA: Diagnosis not present

## 2023-02-25 DIAGNOSIS — Z1331 Encounter for screening for depression: Secondary | ICD-10-CM | POA: Diagnosis not present

## 2023-02-25 DIAGNOSIS — Z125 Encounter for screening for malignant neoplasm of prostate: Secondary | ICD-10-CM | POA: Diagnosis not present

## 2023-02-25 DIAGNOSIS — Z79899 Other long term (current) drug therapy: Secondary | ICD-10-CM | POA: Diagnosis not present

## 2023-02-25 DIAGNOSIS — I10 Essential (primary) hypertension: Secondary | ICD-10-CM | POA: Diagnosis not present

## 2023-04-14 IMAGING — CR DG CHEST 2V
2 series · 2 of 2 positions shown · non-contrast
Comparison: Chest x-ray 05/06/2018.

CLINICAL DATA: 69-year-old male with history of back pain and chest
wall pain.

EXAM:
CHEST - 2 VIEW

[w chest pa]
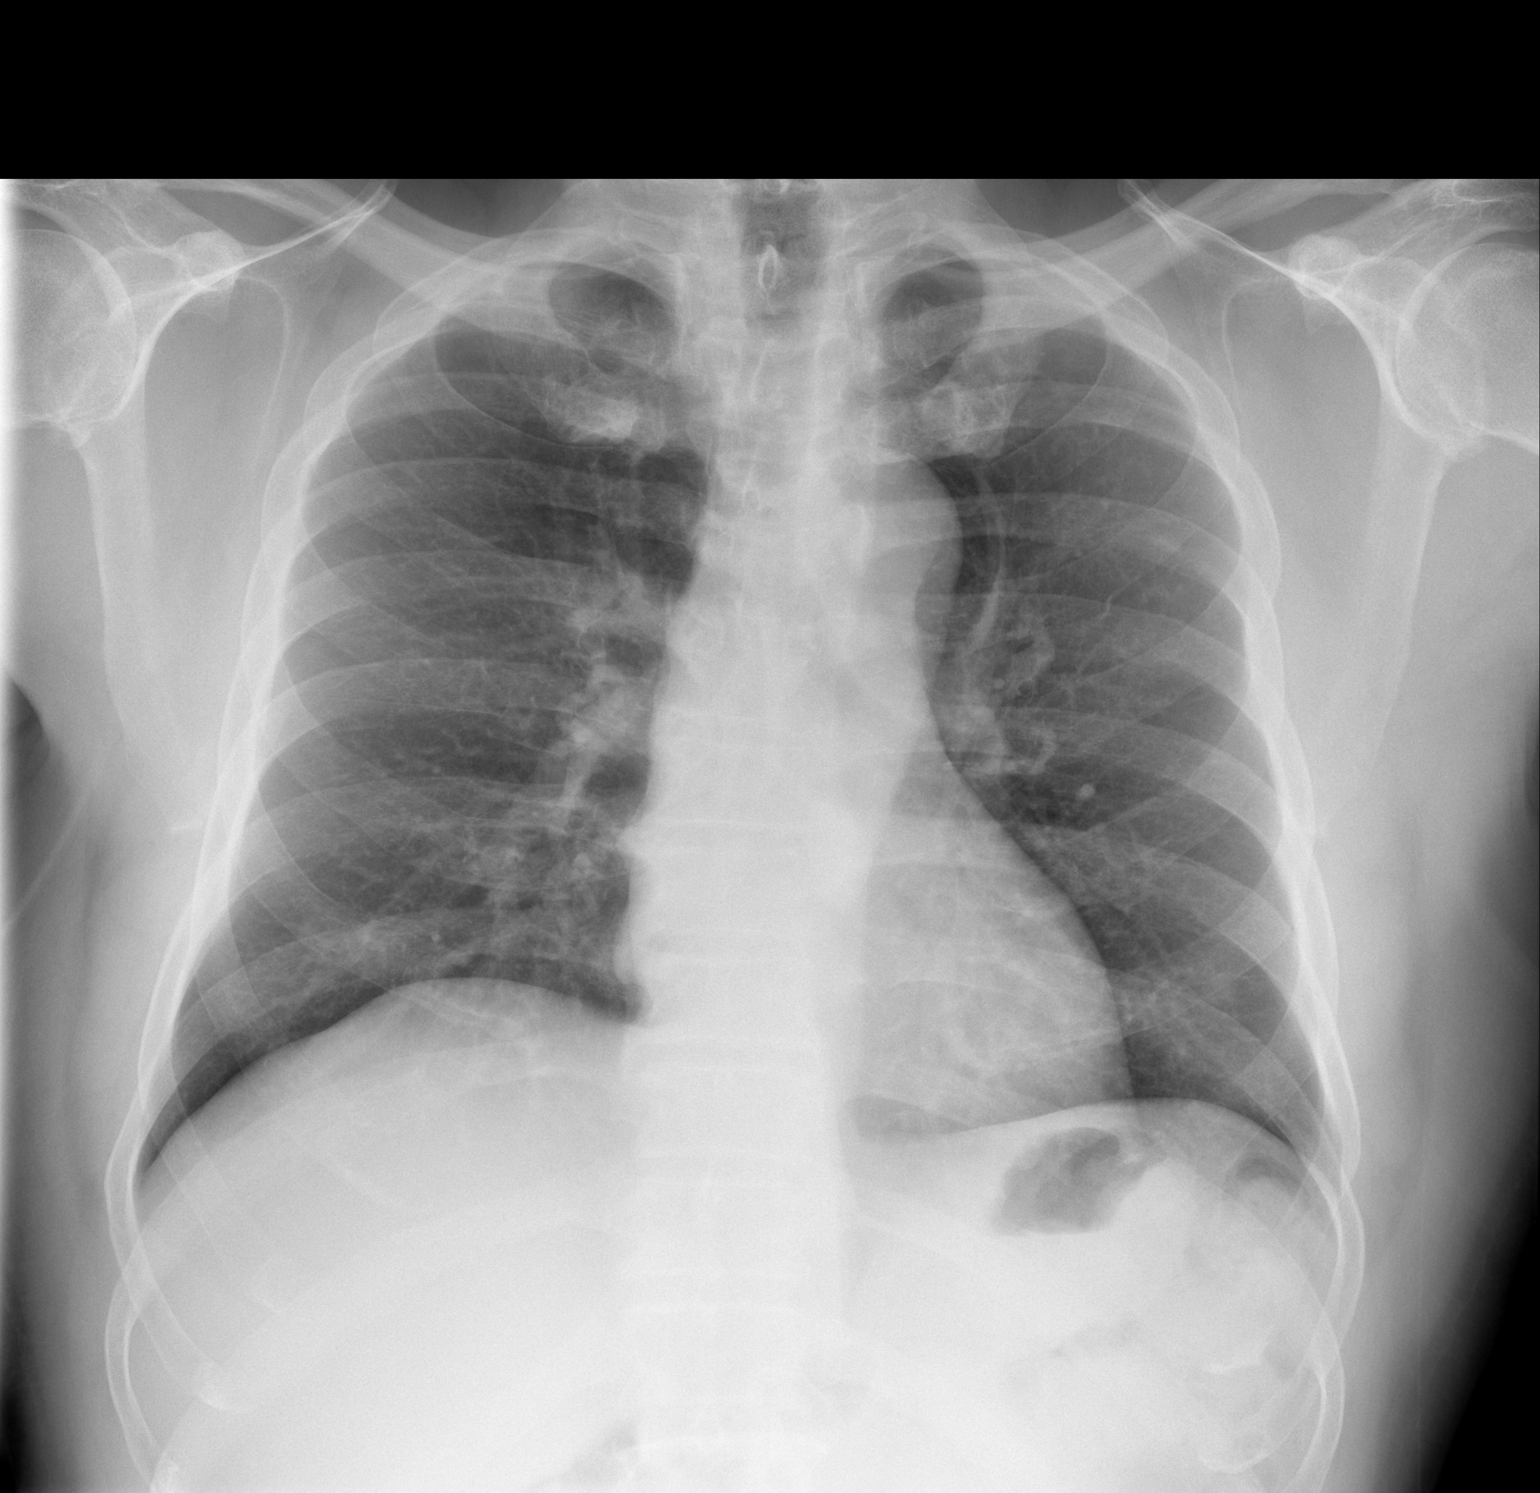

[w chest lat]
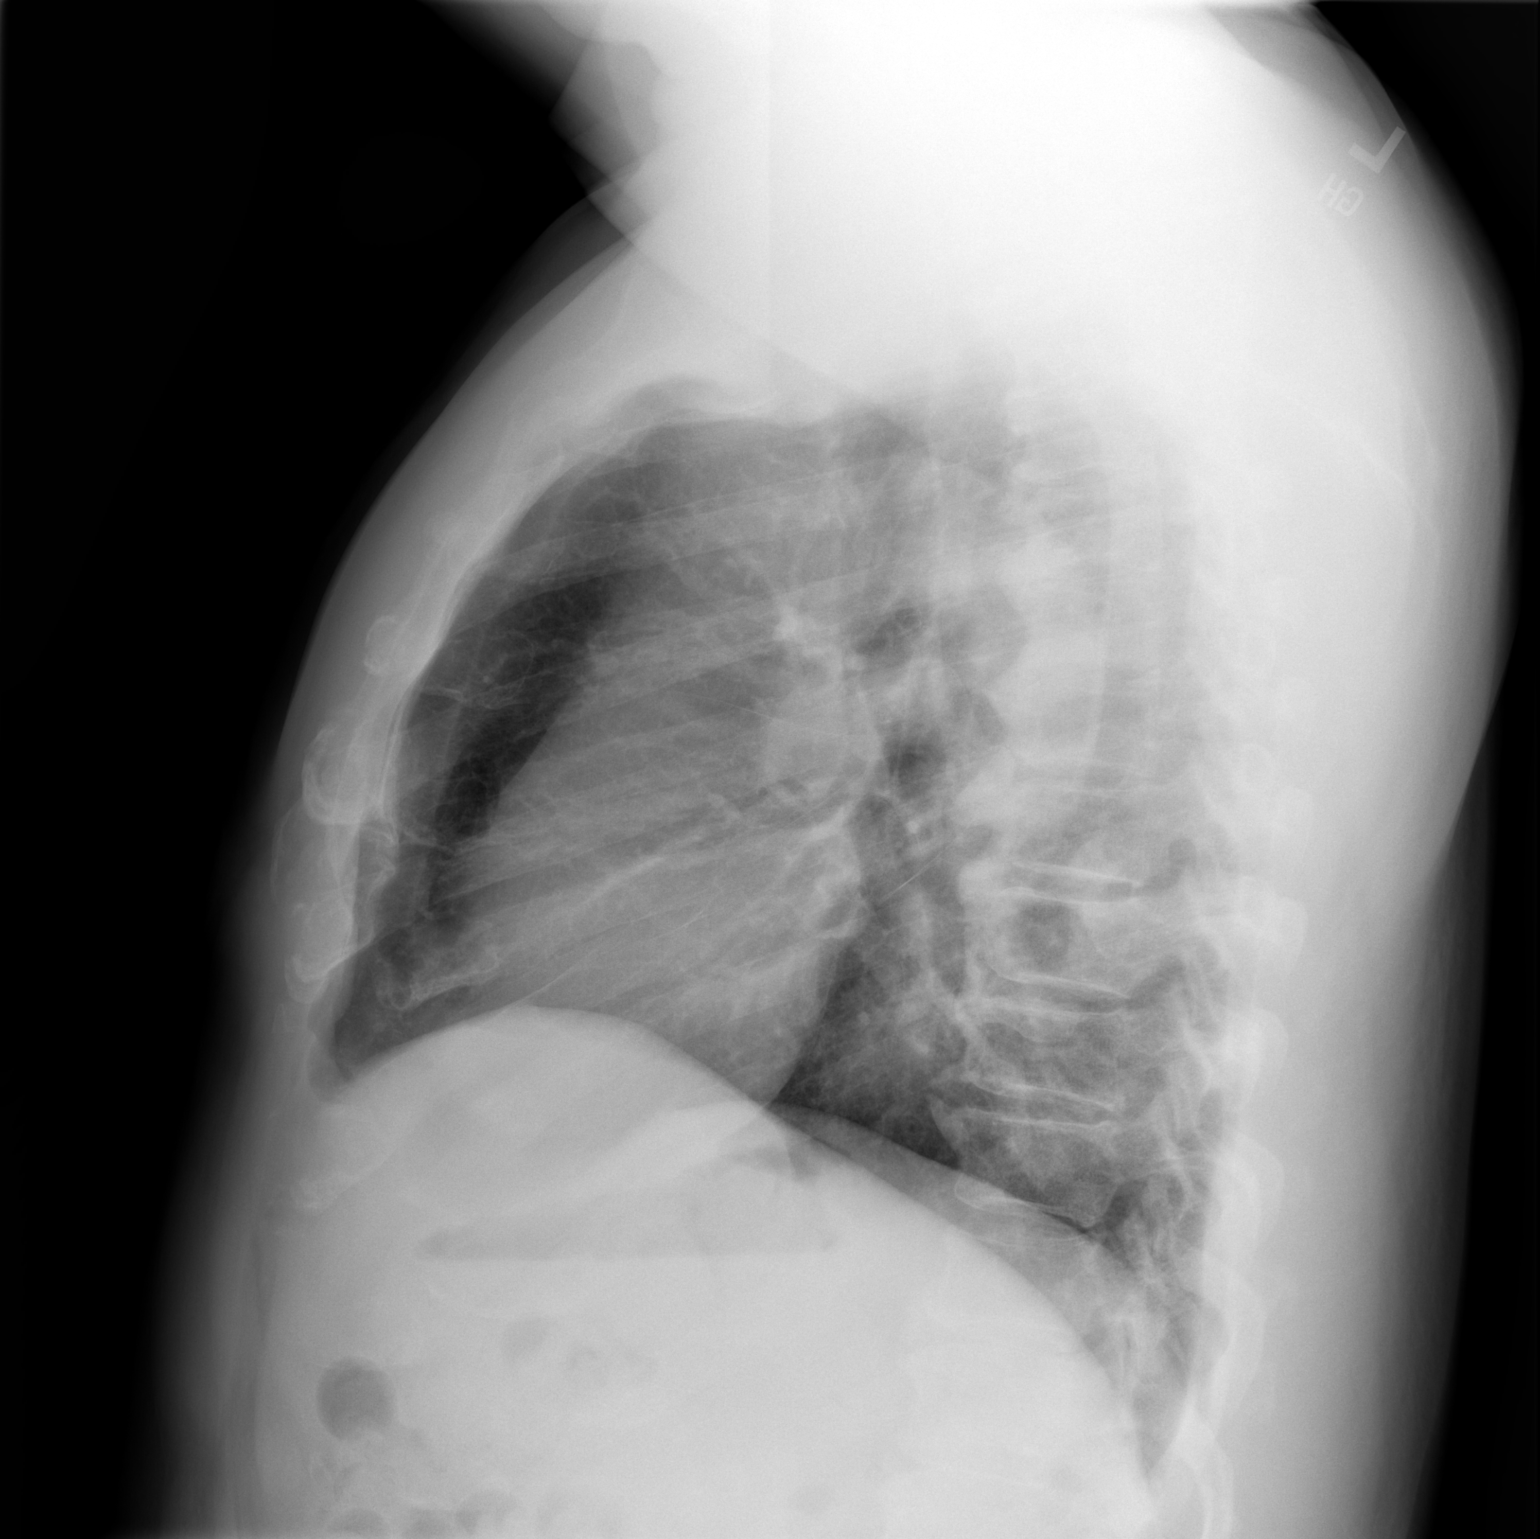

[2 of 2 positions shown; findings below may reference images not displayed]

FINDINGS: Lung volumes are normal. No consolidative airspace disease. No
pleural effusions. No pneumothorax. No pulmonary nodule or mass
noted. Pulmonary vasculature and the cardiomediastinal silhouette
are within normal limits.
IMPRESSION: No radiographic evidence of acute cardiopulmonary disease.

## 2023-04-14 IMAGING — CR DG THORACIC SPINE 3V
3 series · 3 of 3 positions shown · non-contrast
Comparison: Chest x-ray 05/06/2018.

CLINICAL DATA: Right-sided thoracic back pain.

EXAM:
THORACIC SPINE - 3 VIEWS

[w t-spine lat *]
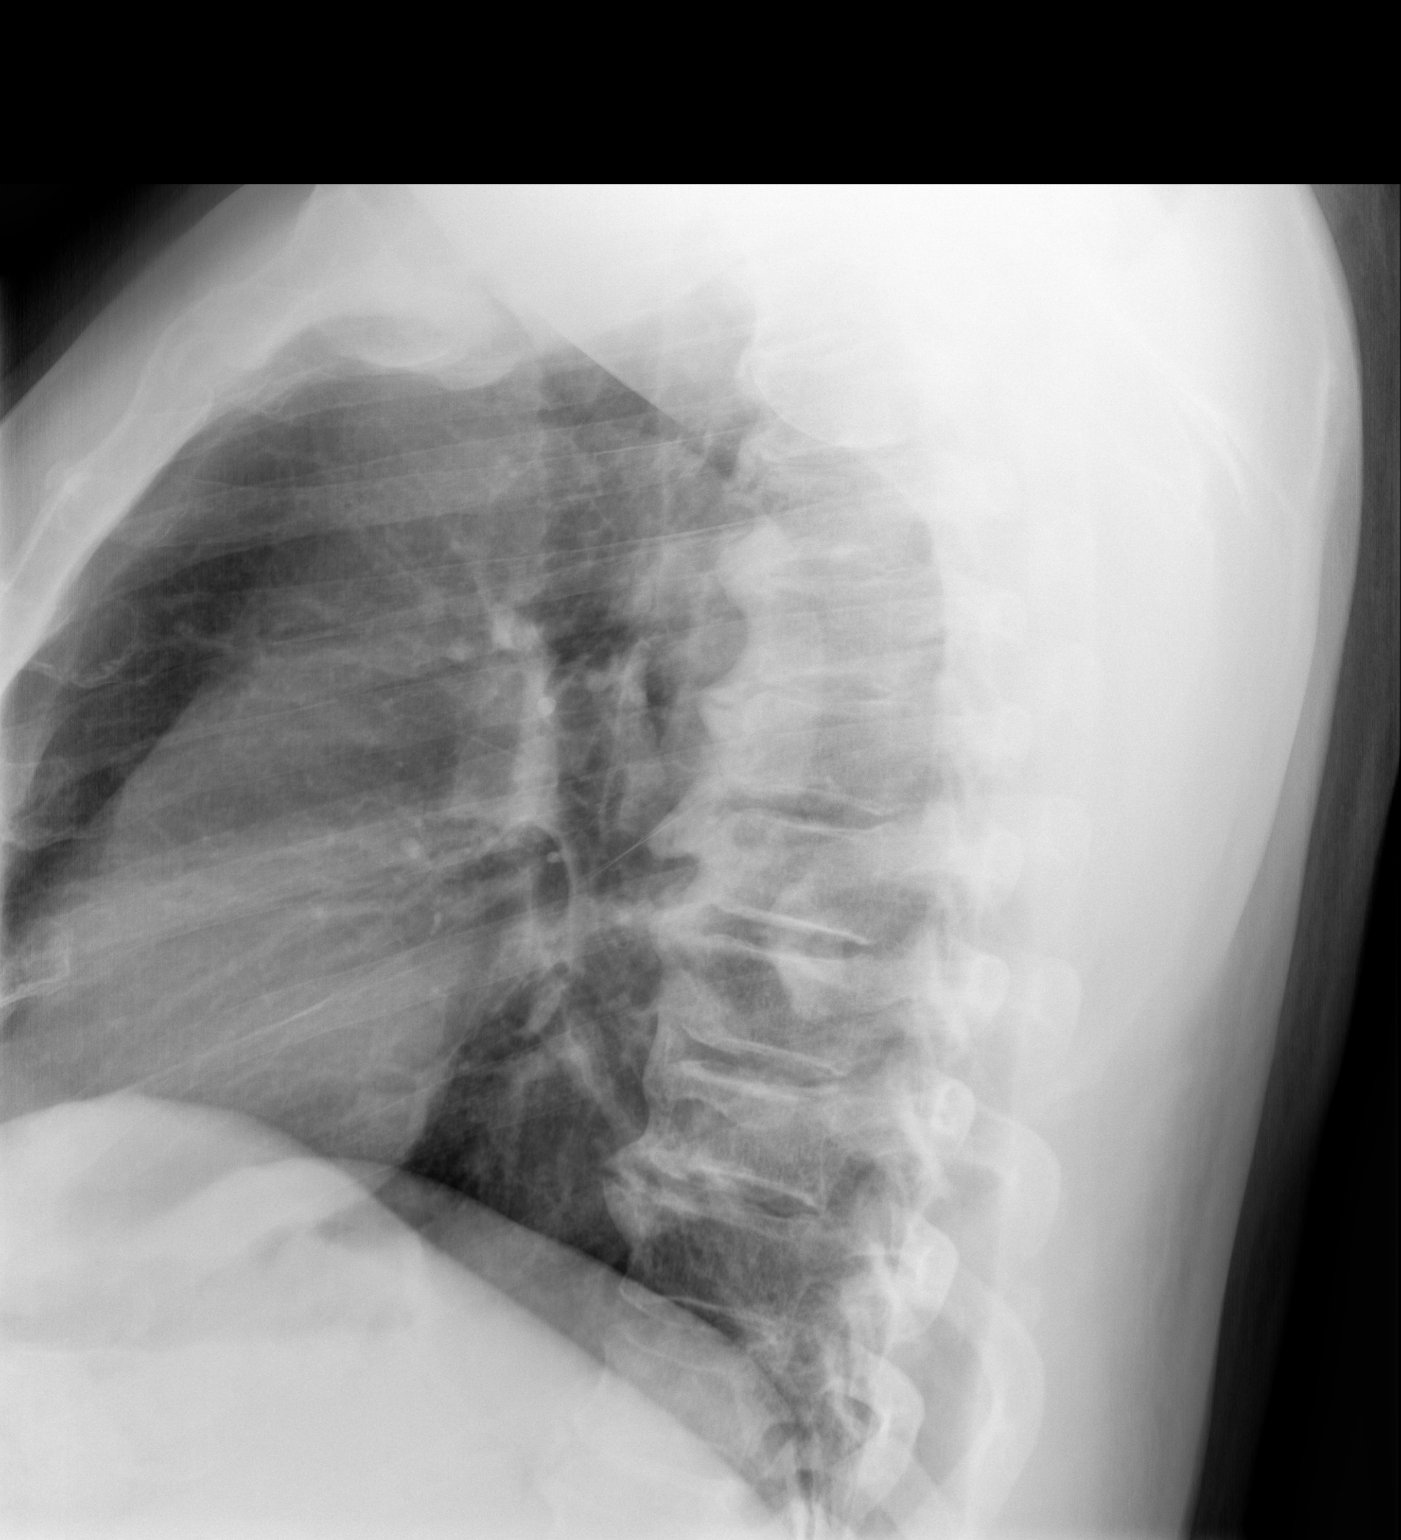

[w swimmers view *]
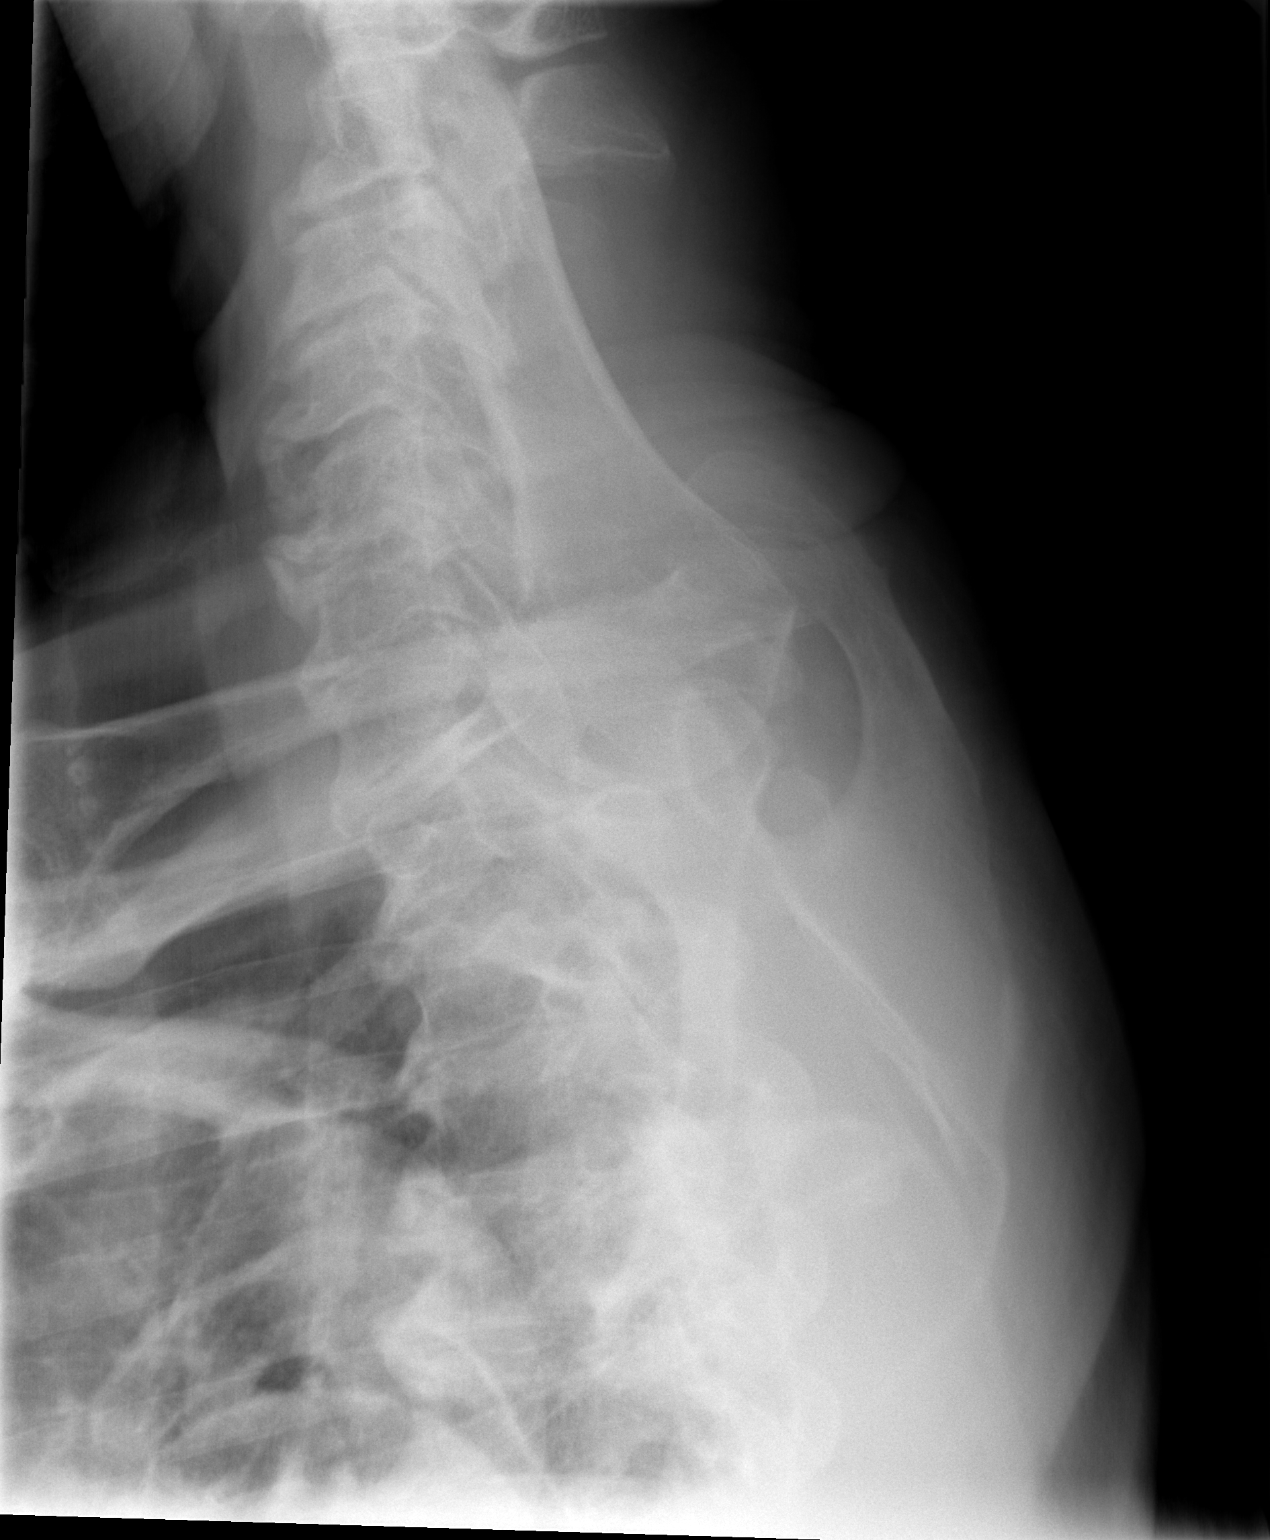

[w t-spine a.p. *]
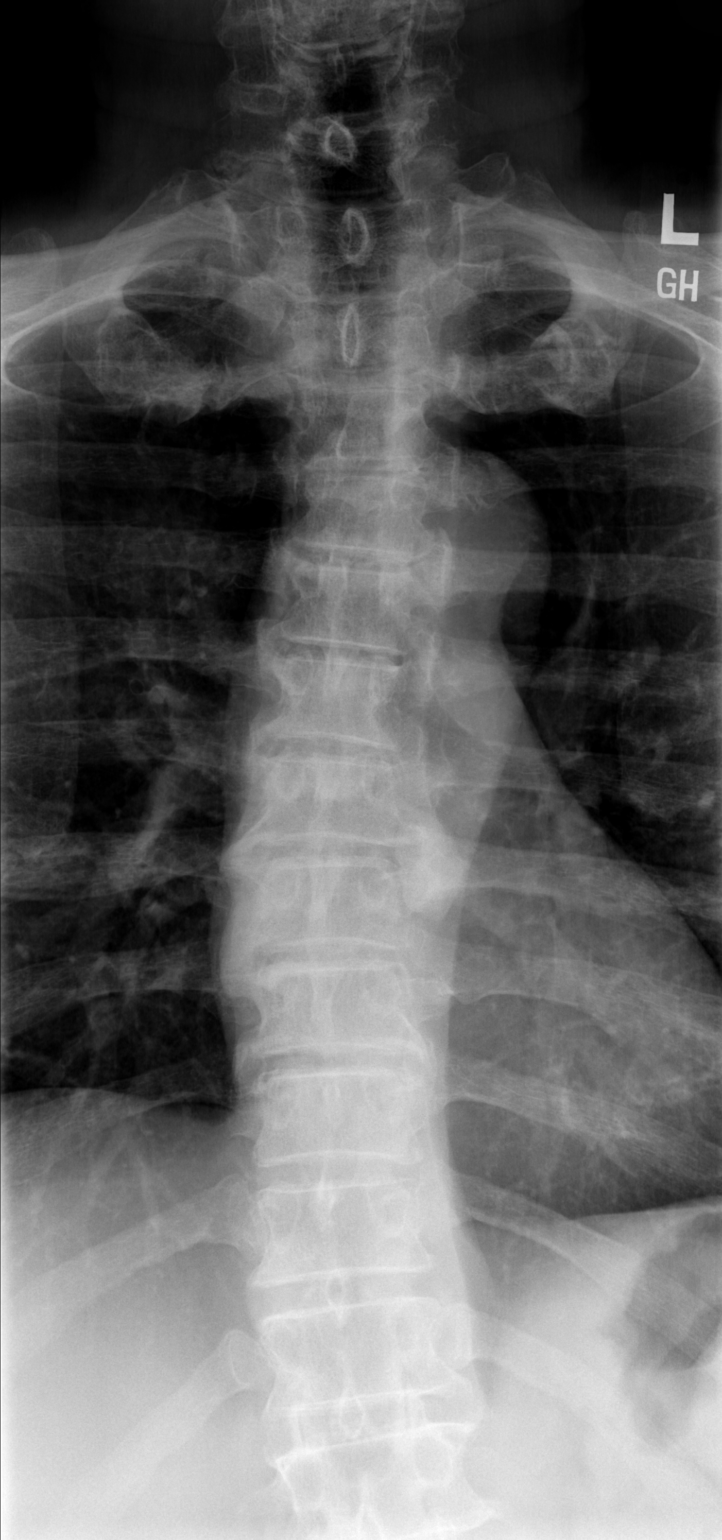

[3 of 3 positions shown; findings below may reference images not displayed]

FINDINGS: S-shaped thoracic spine scoliosis. Diffuse severe multilevel
cervicothoracic degenerative changes again noted. No acute bony
abnormality. No evidence of fracture.
IMPRESSION: Thoracic spine scoliosis with diffuse severe multilevel
cervicothoracic degenerative change. No acute bony abnormality.

## 2023-05-09 DIAGNOSIS — H04123 Dry eye syndrome of bilateral lacrimal glands: Secondary | ICD-10-CM | POA: Diagnosis not present

## 2023-05-09 DIAGNOSIS — H2513 Age-related nuclear cataract, bilateral: Secondary | ICD-10-CM | POA: Diagnosis not present

## 2023-05-28 DIAGNOSIS — M7661 Achilles tendinitis, right leg: Secondary | ICD-10-CM | POA: Diagnosis not present

## 2023-05-28 DIAGNOSIS — M25562 Pain in left knee: Secondary | ICD-10-CM | POA: Diagnosis not present

## 2023-06-03 DIAGNOSIS — M7661 Achilles tendinitis, right leg: Secondary | ICD-10-CM | POA: Diagnosis not present

## 2023-06-07 DIAGNOSIS — M7661 Achilles tendinitis, right leg: Secondary | ICD-10-CM | POA: Diagnosis not present

## 2023-06-10 DIAGNOSIS — M7661 Achilles tendinitis, right leg: Secondary | ICD-10-CM | POA: Diagnosis not present

## 2023-06-12 DIAGNOSIS — M7661 Achilles tendinitis, right leg: Secondary | ICD-10-CM | POA: Diagnosis not present

## 2023-06-17 DIAGNOSIS — M7661 Achilles tendinitis, right leg: Secondary | ICD-10-CM | POA: Diagnosis not present

## 2023-06-19 DIAGNOSIS — M7661 Achilles tendinitis, right leg: Secondary | ICD-10-CM | POA: Diagnosis not present

## 2023-06-25 DIAGNOSIS — M7661 Achilles tendinitis, right leg: Secondary | ICD-10-CM | POA: Diagnosis not present

## 2023-06-26 DIAGNOSIS — Z79899 Other long term (current) drug therapy: Secondary | ICD-10-CM | POA: Diagnosis not present

## 2023-06-27 DIAGNOSIS — M7661 Achilles tendinitis, right leg: Secondary | ICD-10-CM | POA: Diagnosis not present

## 2023-07-01 DIAGNOSIS — M7661 Achilles tendinitis, right leg: Secondary | ICD-10-CM | POA: Diagnosis not present

## 2023-07-03 DIAGNOSIS — M7661 Achilles tendinitis, right leg: Secondary | ICD-10-CM | POA: Diagnosis not present

## 2023-07-09 DIAGNOSIS — M7661 Achilles tendinitis, right leg: Secondary | ICD-10-CM | POA: Diagnosis not present

## 2023-07-11 DIAGNOSIS — M7661 Achilles tendinitis, right leg: Secondary | ICD-10-CM | POA: Diagnosis not present

## 2023-07-23 DIAGNOSIS — M7661 Achilles tendinitis, right leg: Secondary | ICD-10-CM | POA: Diagnosis not present

## 2023-07-30 DIAGNOSIS — H2513 Age-related nuclear cataract, bilateral: Secondary | ICD-10-CM | POA: Diagnosis not present

## 2023-07-30 DIAGNOSIS — E119 Type 2 diabetes mellitus without complications: Secondary | ICD-10-CM | POA: Diagnosis not present

## 2023-07-30 DIAGNOSIS — H524 Presbyopia: Secondary | ICD-10-CM | POA: Diagnosis not present

## 2023-08-12 ENCOUNTER — Other Ambulatory Visit: Payer: Self-pay | Admitting: Internal Medicine

## 2023-08-12 DIAGNOSIS — R0789 Other chest pain: Secondary | ICD-10-CM | POA: Diagnosis not present

## 2023-08-29 ENCOUNTER — Ambulatory Visit
Admission: RE | Admit: 2023-08-29 | Discharge: 2023-08-29 | Disposition: A | Discharge status: Home / Self Care | Source: Ambulatory Visit | Attending: Internal Medicine | Admitting: Internal Medicine

## 2023-08-29 DIAGNOSIS — R079 Chest pain, unspecified: Secondary | ICD-10-CM | POA: Diagnosis not present

## 2023-08-29 DIAGNOSIS — R0789 Other chest pain: Secondary | ICD-10-CM

## 2023-09-11 DIAGNOSIS — J3089 Other allergic rhinitis: Secondary | ICD-10-CM | POA: Diagnosis not present

## 2023-09-11 DIAGNOSIS — R0789 Other chest pain: Secondary | ICD-10-CM | POA: Diagnosis not present

## 2023-09-11 DIAGNOSIS — E1169 Type 2 diabetes mellitus with other specified complication: Secondary | ICD-10-CM | POA: Diagnosis not present

## 2023-09-11 DIAGNOSIS — I10 Essential (primary) hypertension: Secondary | ICD-10-CM | POA: Diagnosis not present

## 2024-02-03 DIAGNOSIS — R3913 Splitting of urinary stream: Secondary | ICD-10-CM | POA: Diagnosis not present

## 2024-02-03 DIAGNOSIS — R1013 Epigastric pain: Secondary | ICD-10-CM | POA: Diagnosis not present

## 2024-02-05 DIAGNOSIS — R1013 Epigastric pain: Secondary | ICD-10-CM | POA: Diagnosis not present

## 2024-02-26 DIAGNOSIS — K279 Peptic ulcer, site unspecified, unspecified as acute or chronic, without hemorrhage or perforation: Secondary | ICD-10-CM | POA: Diagnosis not present

## 2024-03-05 DIAGNOSIS — E1169 Type 2 diabetes mellitus with other specified complication: Secondary | ICD-10-CM | POA: Diagnosis not present

## 2024-03-05 DIAGNOSIS — Z23 Encounter for immunization: Secondary | ICD-10-CM | POA: Diagnosis not present

## 2024-03-05 DIAGNOSIS — R143 Flatulence: Secondary | ICD-10-CM | POA: Diagnosis not present

## 2024-03-05 DIAGNOSIS — Z0000 Encounter for general adult medical examination without abnormal findings: Secondary | ICD-10-CM | POA: Diagnosis not present

## 2024-03-05 DIAGNOSIS — Z1331 Encounter for screening for depression: Secondary | ICD-10-CM | POA: Diagnosis not present

## 2024-03-05 DIAGNOSIS — M1A00X0 Idiopathic chronic gout, unspecified site, without tophus (tophi): Secondary | ICD-10-CM | POA: Diagnosis not present

## 2024-03-05 DIAGNOSIS — R0789 Other chest pain: Secondary | ICD-10-CM | POA: Diagnosis not present

## 2024-03-05 DIAGNOSIS — G729 Myopathy, unspecified: Secondary | ICD-10-CM | POA: Diagnosis not present

## 2024-03-05 DIAGNOSIS — I10 Essential (primary) hypertension: Secondary | ICD-10-CM | POA: Diagnosis not present

## 2024-03-05 DIAGNOSIS — Z2981 Encounter for HIV pre-exposure prophylaxis: Secondary | ICD-10-CM | POA: Diagnosis not present

## 2024-03-05 DIAGNOSIS — N401 Enlarged prostate with lower urinary tract symptoms: Secondary | ICD-10-CM | POA: Diagnosis not present

## 2024-03-05 DIAGNOSIS — Z79899 Other long term (current) drug therapy: Secondary | ICD-10-CM | POA: Diagnosis not present

## 2024-03-10 DIAGNOSIS — Z8619 Personal history of other infectious and parasitic diseases: Secondary | ICD-10-CM | POA: Diagnosis not present

## 2024-03-10 DIAGNOSIS — R143 Flatulence: Secondary | ICD-10-CM | POA: Diagnosis not present

## 2024-03-10 DIAGNOSIS — R142 Eructation: Secondary | ICD-10-CM | POA: Diagnosis not present

## 2024-03-10 DIAGNOSIS — R197 Diarrhea, unspecified: Secondary | ICD-10-CM | POA: Diagnosis not present

## 2024-03-11 DIAGNOSIS — Z8619 Personal history of other infectious and parasitic diseases: Secondary | ICD-10-CM | POA: Diagnosis not present
